# Patient Record
Sex: Female | Born: 1950 | Race: White | Hispanic: No | Smoking: Former smoker
Health system: Southern US, Community
[De-identification: ages and names within clinical notes are randomized; demographics above are authoritative.]

## PROBLEM LIST (undated history)

## (undated) DIAGNOSIS — Z932 Ileostomy status: Secondary | ICD-10-CM

## (undated) DIAGNOSIS — Z8489 Family history of other specified conditions: Secondary | ICD-10-CM

## (undated) DIAGNOSIS — S82841A Displaced bimalleolar fracture of right lower leg, initial encounter for closed fracture: Secondary | ICD-10-CM

## (undated) DIAGNOSIS — R112 Nausea with vomiting, unspecified: Secondary | ICD-10-CM

## (undated) DIAGNOSIS — Z98811 Dental restoration status: Secondary | ICD-10-CM

## (undated) DIAGNOSIS — M8580 Other specified disorders of bone density and structure, unspecified site: Secondary | ICD-10-CM

## (undated) DIAGNOSIS — Z8719 Personal history of other diseases of the digestive system: Secondary | ICD-10-CM

## (undated) DIAGNOSIS — L900 Lichen sclerosus et atrophicus: Secondary | ICD-10-CM

## (undated) DIAGNOSIS — Z9889 Other specified postprocedural states: Secondary | ICD-10-CM

## (undated) HISTORY — PX: TUBAL LIGATION: SHX77

## (undated) HISTORY — PX: REDUCTION MAMMAPLASTY: SUR839

## (undated) HISTORY — DX: Lichen sclerosus et atrophicus: L90.0

## (undated) HISTORY — DX: Other specified disorders of bone density and structure, unspecified site: M85.80

## (undated) HISTORY — PX: STRABISMUS SURGERY: SHX218

---

## 2002-03-02 HISTORY — PX: TOTAL COLECTOMY: SHX852

## 2007-03-10 ENCOUNTER — Emergency Department (HOSPITAL_COMMUNITY): Admission: EM | Admit: 2007-03-10 | Discharge: 2007-03-10 | Payer: Self-pay | Admitting: Emergency Medicine

## 2007-05-09 ENCOUNTER — Other Ambulatory Visit: Admission: RE | Admit: 2007-05-09 | Discharge: 2007-05-09 | Payer: Self-pay | Admitting: Internal Medicine

## 2008-09-24 ENCOUNTER — Ambulatory Visit: Payer: Self-pay | Admitting: Internal Medicine

## 2008-10-19 ENCOUNTER — Encounter: Admission: RE | Admit: 2008-10-19 | Discharge: 2008-10-19 | Payer: Self-pay | Admitting: Internal Medicine

## 2008-10-25 ENCOUNTER — Encounter: Admission: RE | Admit: 2008-10-25 | Discharge: 2008-10-25 | Payer: Self-pay | Admitting: Internal Medicine

## 2008-11-16 ENCOUNTER — Ambulatory Visit: Payer: Self-pay | Admitting: Internal Medicine

## 2008-12-27 ENCOUNTER — Ambulatory Visit: Payer: Self-pay | Admitting: Internal Medicine

## 2009-03-02 HISTORY — PX: BREAST REDUCTION SURGERY: SHX8

## 2009-03-29 ENCOUNTER — Ambulatory Visit: Payer: Self-pay | Admitting: Internal Medicine

## 2009-09-12 ENCOUNTER — Ambulatory Visit: Payer: Self-pay | Admitting: Internal Medicine

## 2009-10-17 ENCOUNTER — Encounter: Admission: RE | Admit: 2009-10-17 | Discharge: 2009-10-17 | Payer: Self-pay | Admitting: Internal Medicine

## 2009-11-25 ENCOUNTER — Encounter: Admission: RE | Admit: 2009-11-25 | Discharge: 2009-11-25 | Payer: Self-pay | Admitting: Internal Medicine

## 2009-11-29 ENCOUNTER — Encounter: Admission: RE | Admit: 2009-11-29 | Discharge: 2009-11-29 | Payer: Self-pay | Admitting: Internal Medicine

## 2009-12-06 ENCOUNTER — Ambulatory Visit: Payer: Self-pay | Admitting: Internal Medicine

## 2010-02-11 ENCOUNTER — Encounter
Admission: RE | Admit: 2010-02-11 | Discharge: 2010-02-11 | Payer: Self-pay | Source: Home / Self Care | Attending: Surgery | Admitting: Surgery

## 2010-11-26 ENCOUNTER — Other Ambulatory Visit: Payer: Self-pay | Admitting: Internal Medicine

## 2010-11-26 DIAGNOSIS — Z1231 Encounter for screening mammogram for malignant neoplasm of breast: Secondary | ICD-10-CM

## 2010-12-22 ENCOUNTER — Ambulatory Visit
Admission: RE | Admit: 2010-12-22 | Discharge: 2010-12-22 | Disposition: A | Discharge status: Home / Self Care | Payer: BC Managed Care – PPO | Source: Ambulatory Visit | Attending: Internal Medicine | Admitting: Internal Medicine

## 2010-12-22 DIAGNOSIS — Z1231 Encounter for screening mammogram for malignant neoplasm of breast: Secondary | ICD-10-CM

## 2011-11-05 ENCOUNTER — Other Ambulatory Visit: Payer: BC Managed Care – PPO | Admitting: Internal Medicine

## 2011-11-05 DIAGNOSIS — M810 Age-related osteoporosis without current pathological fracture: Secondary | ICD-10-CM

## 2011-11-05 DIAGNOSIS — E559 Vitamin D deficiency, unspecified: Secondary | ICD-10-CM

## 2011-11-05 DIAGNOSIS — Z0000 Encounter for general adult medical examination without abnormal findings: Secondary | ICD-10-CM

## 2011-11-05 LAB — CBC WITH DIFFERENTIAL/PLATELET
Eosinophils Absolute: 0.3 10*3/uL (ref 0.0–0.7)
Eosinophils Relative: 5 % (ref 0–5)
HCT: 38.8 % (ref 36.0–46.0)
Hemoglobin: 13.3 g/dL (ref 12.0–15.0)
Lymphocytes Relative: 22 % (ref 12–46)
Lymphs Abs: 1.2 10*3/uL (ref 0.7–4.0)
MCH: 32.4 pg (ref 26.0–34.0)
MCV: 94.6 fL (ref 78.0–100.0)
Monocytes Absolute: 0.5 10*3/uL (ref 0.1–1.0)
Monocytes Relative: 9 % (ref 3–12)
RBC: 4.1 MIL/uL (ref 3.87–5.11)
WBC: 5.3 10*3/uL (ref 4.0–10.5)

## 2011-11-05 LAB — LIPID PANEL
Cholesterol: 170 mg/dL (ref 0–200)
Total CHOL/HDL Ratio: 1.8 Ratio
Triglycerides: 68 mg/dL (ref <150)
VLDL: 14 mg/dL (ref 0–40)

## 2011-11-05 LAB — COMPREHENSIVE METABOLIC PANEL
ALT: 33 U/L (ref 0–35)
CO2: 26 mEq/L (ref 19–32)
Calcium: 9.2 mg/dL (ref 8.4–10.5)
Chloride: 106 mEq/L (ref 96–112)
Creat: 0.87 mg/dL (ref 0.50–1.10)
Glucose, Bld: 95 mg/dL (ref 70–99)
Total Bilirubin: 0.4 mg/dL (ref 0.3–1.2)

## 2011-11-05 LAB — T4, FREE: Free T4: 0.96 ng/dL (ref 0.80–1.80)

## 2011-11-06 LAB — VITAMIN D 25 HYDROXY (VIT D DEFICIENCY, FRACTURES): Vit D, 25-Hydroxy: 25 ng/mL — ABNORMAL LOW (ref 30–89)

## 2011-11-09 ENCOUNTER — Ambulatory Visit (INDEPENDENT_AMBULATORY_CARE_PROVIDER_SITE_OTHER): Payer: BC Managed Care – PPO | Admitting: Internal Medicine

## 2011-11-09 ENCOUNTER — Encounter: Payer: Self-pay | Admitting: Internal Medicine

## 2011-11-09 VITALS — BP 122/70 | HR 70 | Ht 65.25 in | Wt 185.0 lb

## 2011-11-09 DIAGNOSIS — Z9889 Other specified postprocedural states: Secondary | ICD-10-CM

## 2011-11-09 DIAGNOSIS — Z0000 Encounter for general adult medical examination without abnormal findings: Secondary | ICD-10-CM

## 2011-11-09 DIAGNOSIS — M810 Age-related osteoporosis without current pathological fracture: Secondary | ICD-10-CM

## 2011-11-09 DIAGNOSIS — Z87898 Personal history of other specified conditions: Secondary | ICD-10-CM

## 2011-11-09 DIAGNOSIS — Z8639 Personal history of other endocrine, nutritional and metabolic disease: Secondary | ICD-10-CM

## 2011-11-09 DIAGNOSIS — K5090 Crohn's disease, unspecified, without complications: Secondary | ICD-10-CM

## 2011-11-09 LAB — POCT URINALYSIS DIPSTICK
Bilirubin, UA: NEGATIVE
Blood, UA: NEGATIVE
Glucose, UA: NEGATIVE
Nitrite, UA: NEGATIVE
Spec Grav, UA: 1.025
Urobilinogen, UA: NEGATIVE

## 2011-11-13 ENCOUNTER — Other Ambulatory Visit: Payer: Self-pay | Admitting: Internal Medicine

## 2011-11-16 ENCOUNTER — Other Ambulatory Visit: Payer: Self-pay | Admitting: Internal Medicine

## 2011-11-16 DIAGNOSIS — M810 Age-related osteoporosis without current pathological fracture: Secondary | ICD-10-CM

## 2011-11-16 DIAGNOSIS — Z780 Asymptomatic menopausal state: Secondary | ICD-10-CM

## 2011-11-17 ENCOUNTER — Other Ambulatory Visit: Payer: Self-pay | Admitting: Internal Medicine

## 2011-11-17 DIAGNOSIS — Z1231 Encounter for screening mammogram for malignant neoplasm of breast: Secondary | ICD-10-CM

## 2011-12-25 ENCOUNTER — Ambulatory Visit
Admission: RE | Admit: 2011-12-25 | Discharge: 2011-12-25 | Disposition: A | Discharge status: Home / Self Care | Payer: BC Managed Care – PPO | Source: Ambulatory Visit | Attending: Internal Medicine | Admitting: Internal Medicine

## 2011-12-25 DIAGNOSIS — M810 Age-related osteoporosis without current pathological fracture: Secondary | ICD-10-CM

## 2011-12-25 DIAGNOSIS — Z1231 Encounter for screening mammogram for malignant neoplasm of breast: Secondary | ICD-10-CM

## 2011-12-25 DIAGNOSIS — Z780 Asymptomatic menopausal state: Secondary | ICD-10-CM

## 2012-02-07 ENCOUNTER — Encounter: Payer: Self-pay | Admitting: Internal Medicine

## 2012-02-07 DIAGNOSIS — Z9889 Other specified postprocedural states: Secondary | ICD-10-CM | POA: Insufficient documentation

## 2012-02-07 DIAGNOSIS — K5090 Crohn's disease, unspecified, without complications: Secondary | ICD-10-CM | POA: Insufficient documentation

## 2012-02-07 DIAGNOSIS — M810 Age-related osteoporosis without current pathological fracture: Secondary | ICD-10-CM | POA: Insufficient documentation

## 2012-02-07 NOTE — Patient Instructions (Addendum)
Continue same medications and return in one year. For dependent edema try Maxide 25 1 by mouth as needed

## 2012-02-07 NOTE — Progress Notes (Signed)
  Subjective:    Patient ID: Beverly [name], female    DOB: 1951-01-17, 61 y.o.   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 History of osteoporosis, vitamin D deficiency and history of Crohn's disease. Patient is intolerant of Flagyl it causes numbness in her feet. History of stress fracture right foot October 2008. Patient had colectomy March 2004 for inflammatory bowel disease. Patient had surgery for strabismus in childhood and also on both eyes in 1989. Bilateral tubal ligation 1984. Patient had sigmoidoscopy by Dr. Ewing Schlein in July 2010. This history of bilateral breast reductions. She is to take Fosamax but quit in 2008. In April 2005 she [place]. Lumbar spine had a CEA value of -2.0 and right hip had a T. value of -2.6. This was consistent with osteoporosis.  Study in 2010 showed some improvement in bone density in right hip and lumbar spine.  Patient has been on Imuran since 2004. Followed by Dr. Ewing Schlein for inflammatory bowel disease. Crohn's disease with diagnosed in 1984.  Family history: Father died age 62 with history of prostate cancer. One sister with history of fibromyalgia and prediabetes. 2 adult sons. Mother with history of cancer.  Social history: Patient has a Event organiser and J.D. Degree. Patient lives with her son. She is divorced. Does not smoke. She quit smoking in 2002. Social alcohol consumption consisting of wine daily.    Review of Systems  Constitutional: Positive for fatigue.  HENT: Negative.   Eyes: Negative.   Respiratory: Negative.   Cardiovascular: Negative.   Gastrointestinal:       History of Crohn's disease  Genitourinary: Negative.   Musculoskeletal:       History of osteoporosis  Neurological: Negative.   Hematological: Negative.   Psychiatric/Behavioral: Negative.        Objective:   Physical Exam   Vitals reviewed. Constitutional: She appears well-developed and well-nourished. No distress.  HENT:  Head: Normocephalic and atraumatic.  Right Ear: External ear normal.  Left Ear: External ear normal.  Mouth/Throat: Oropharynx is clear and moist.  Eyes: Conjunctivae normal and EOM are normal. Right eye exhibits no discharge. Left eye exhibits no discharge. No scleral icterus.  Neck: Neck supple. No JVD present. No thyromegaly present.  Cardiovascular: Normal rate, regular rhythm, normal heart sounds and intact distal pulses.   No murmur heard. Pulmonary/Chest: Effort normal and breath sounds normal. No respiratory distress. She has no wheezes. She has no rales. She exhibits no tenderness.       Breast reduction noted. No masses.   Abdominal: Bowel sounds are normal. She exhibits no distension and no mass. There is no tenderness. There is no rebound and no guarding.  Musculoskeletal: She exhibits no edema.  Lymphadenopathy:    She has no cervical adenopathy.  Neurological: She is alert. She has normal reflexes. No cranial nerve deficit.  Skin: Skin is dry. No rash noted. She is not diaphoretic.  Psychiatric: Her behavior is normal. Judgment and thought content normal.          Assessment & Plan:  Crohn's disease  Status post bilateral breast reduction by Dr. Stephens November  Osteoporosis  History of vitamin D deficiency  Plan: Followup of Crohn's disease per Dr. Ewing Schlein. Continue vitamin D. Bone density study done December 2011 and should repeat late this year or earlier next year.  Return one year or as needed.

## 2012-05-03 ENCOUNTER — Encounter: Payer: Self-pay | Admitting: Gynecology

## 2012-05-03 DIAGNOSIS — M8580 Other specified disorders of bone density and structure, unspecified site: Secondary | ICD-10-CM | POA: Insufficient documentation

## 2012-05-03 DIAGNOSIS — L900 Lichen sclerosus et atrophicus: Secondary | ICD-10-CM | POA: Insufficient documentation

## 2012-06-21 ENCOUNTER — Institutional Professional Consult (permissible substitution): Payer: Self-pay | Admitting: Gynecology

## 2012-07-07 ENCOUNTER — Telehealth: Payer: Self-pay | Admitting: Gynecology

## 2012-07-07 NOTE — Telephone Encounter (Signed)
Patient cancelled her consult appointment for 07/11/12 with Dr. Irene Limbo at [facility]. I called the patient and left a message for her to call us to reschedule.

## 2012-07-11 ENCOUNTER — Institutional Professional Consult (permissible substitution): Payer: Self-pay | Admitting: Gynecology

## 2012-07-11 ENCOUNTER — Ambulatory Visit (INDEPENDENT_AMBULATORY_CARE_PROVIDER_SITE_OTHER): Payer: BC Managed Care – PPO | Admitting: Gynecology

## 2012-07-11 ENCOUNTER — Encounter: Payer: Self-pay | Admitting: Gynecology

## 2012-07-11 VITALS — BP 120/72 | Resp 12 | Wt 186.0 lb

## 2012-07-11 DIAGNOSIS — L940 Localized scleroderma [morphea]: Secondary | ICD-10-CM

## 2012-07-11 DIAGNOSIS — L900 Lichen sclerosus et atrophicus: Secondary | ICD-10-CM

## 2012-07-11 MED ORDER — CLOBETASOL PROPIONATE 0.05 % EX OINT: TOPICAL_OINTMENT | Freq: Two times a day (BID) | CUTANEOUS | Status: DC

## 2012-07-11 NOTE — Progress Notes (Signed)
Subjective:     Patient ID: Beverly [name], female   DOB: January 13, 1951, 62 y.o.   MRN: [id number]  HPI Comments: Here for f/u of lichen sclerosis using temovate mostly once a day, denies any itching and burning.      Review of Systems  All other systems reviewed and are negative.       Objective:   Physical Exam  Genitourinary:     vaginal introitus pliable easy passage of 2 examining fingers     Assessment:     Lichen sclerosis     Plan:     Decrease topical application to qd except at labia-pt would like to con't BID as area con't to improve, remaining tissue stable.  Discussed tapering down ultimately to twice weekly

## 2012-09-12 ENCOUNTER — Ambulatory Visit: Payer: BC Managed Care – PPO | Admitting: Gynecology

## 2012-09-19 ENCOUNTER — Ambulatory Visit (INDEPENDENT_AMBULATORY_CARE_PROVIDER_SITE_OTHER): Payer: BC Managed Care – PPO | Admitting: Gynecology

## 2012-09-19 ENCOUNTER — Encounter: Payer: Self-pay | Admitting: Gynecology

## 2012-09-19 VITALS — BP 100/60 | HR 60 | Ht 65.5 in | Wt 186.0 lb

## 2012-09-19 DIAGNOSIS — L900 Lichen sclerosus et atrophicus: Secondary | ICD-10-CM

## 2012-09-19 DIAGNOSIS — L940 Localized scleroderma [morphea]: Secondary | ICD-10-CM

## 2012-09-19 NOTE — Progress Notes (Signed)
Subjective:     Patient ID: Beverly [name], female   DOB: [birth year], 62 y.o.   MRN: [id number]  HPI Comments: Here for f/u of lichen sclerosis, using temovate mostly twice a day near clitoris and weekly on remaining introitus.  Overall she feel like the tissue continues to improve.    Review of Systems  All other systems reviewed and are negative.       Objective:   Physical Exam  Constitutional: She is oriented to person, place, and time. She appears well-developed and well-nourished.  Genitourinary:     Neurological: She is alert and oriented to person, place, and time. She has normal reflexes.   Minora on right continue to imporve, still less prominent than on the left, hood still scarred but able to retract hood with pressure, remaining introitus is supple, no skin changes or exxcoriations    Assessment:     Lichen sclerosis     Plan:     Cont current treament, can try once a dy near clitoral area Pt informed that full hood retraction is difficult to obtain and probably not possible

## 2012-11-28 ENCOUNTER — Ambulatory Visit (INDEPENDENT_AMBULATORY_CARE_PROVIDER_SITE_OTHER): Payer: BC Managed Care – PPO | Admitting: Gynecology

## 2012-11-28 VITALS — BP 110/66 | HR 68 | Resp 12 | Ht 65.5 in | Wt 188.0 lb

## 2012-11-28 DIAGNOSIS — L940 Localized scleroderma [morphea]: Secondary | ICD-10-CM

## 2012-11-28 DIAGNOSIS — L900 Lichen sclerosus et atrophicus: Secondary | ICD-10-CM

## 2012-11-28 NOTE — Patient Instructions (Addendum)
Decrease temovate to twice a week for 2w then down to once a week there after

## 2012-11-29 NOTE — Progress Notes (Signed)
Subjective:     Patient ID: Beverly [name], female   DOB: Jun 11, 1950, 62 y.o.   MRN: [id number]  HPI Comments: Pt here to follow up lichen sclerosis, using temovate daily to affected areas and overall feels well.  Pt reports not sexually active currently and has no vaginal complaints.    Review of Systems  All other systems reviewed and are negative.       Objective:   Physical Exam  Constitutional: She is oriented to person, place, and time. She appears well-developed and well-nourished.  Genitourinary:     Neurological: She is alert and oriented to person, place, and time.  Skin: Skin is warm and dry.  no other skin changes, no parchement like tissue     Assessment:     Lichen sclerosis doing well     Plan:     Decrease temovate to twice a week fro 2w then decrease to q Sunday F/u 62m

## 2013-07-28 ENCOUNTER — Ambulatory Visit (INDEPENDENT_AMBULATORY_CARE_PROVIDER_SITE_OTHER): Payer: BC Managed Care – PPO | Admitting: Internal Medicine

## 2013-07-28 ENCOUNTER — Encounter: Payer: Self-pay | Admitting: Internal Medicine

## 2013-07-28 VITALS — BP 124/64 | HR 68 | Ht 65.0 in | Wt 189.5 lb

## 2013-07-28 DIAGNOSIS — Z8639 Personal history of other endocrine, nutritional and metabolic disease: Secondary | ICD-10-CM

## 2013-07-28 DIAGNOSIS — S86911A Strain of unspecified muscle(s) and tendon(s) at lower leg level, right leg, initial encounter: Secondary | ICD-10-CM

## 2013-07-28 MED ORDER — MELOXICAM 15 MG PO TABS
15.0000 mg | ORAL_TABLET | Freq: Every day | ORAL | Status: DC
Start: 2013-07-28 — End: 2013-11-09

## 2013-07-28 MED ORDER — TRIAMTERENE-HCTZ 37.5-25 MG PO TABS: 1.0000 | ORAL_TABLET | ORAL | Status: DC | PRN

## 2013-07-28 NOTE — Progress Notes (Signed)
   Subjective:    Patient ID: Beverly [name], female    DOB: 03-12-1950, 63 y.o.   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 She has a history of osteoporosis, vitamin D deficiency and Crohn's disease. Recently her 11 year old mother moved in with patient and previously lived in [place]. Patient has been experiencing pain in her right knee for several weeks. Knee does not give way with her. She's not noted any swelling. She has an upcoming physical exam here in the near future but wanted to be evaluated before physical exam.    Review of Systems     Objective:   Physical Exam no effusion of right knee. No joint line tenderness. Good range of motion. Tender along the lateral collateral ligament to palpation. Not tender along medial collateral ligament.        Assessment & Plan:  Knee strain  History of osteoporosis  History of Crohn's disease  History of vitamin D deficiency  Plan: Ice knee for 20 minutes daily. Take Mobic 15 mg daily. Call if not better in 2 weeks.

## 2013-07-28 NOTE — Patient Instructions (Signed)
Ice knee for 20 minutes daily. Take Mobic 15 mg daily. Call if not better in 2 weeks.

## 2013-08-28 ENCOUNTER — Other Ambulatory Visit: Payer: BC Managed Care – PPO | Admitting: Internal Medicine

## 2013-08-28 ENCOUNTER — Other Ambulatory Visit: Payer: Self-pay | Admitting: Internal Medicine

## 2013-08-28 DIAGNOSIS — Z130 Encounter for screening for diseases of the blood and blood-forming organs and certain disorders involving the immune mechanism: Secondary | ICD-10-CM

## 2013-08-28 DIAGNOSIS — E559 Vitamin D deficiency, unspecified: Secondary | ICD-10-CM

## 2013-08-28 DIAGNOSIS — Z1329 Encounter for screening for other suspected endocrine disorder: Secondary | ICD-10-CM

## 2013-08-28 DIAGNOSIS — Z0000 Encounter for general adult medical examination without abnormal findings: Secondary | ICD-10-CM

## 2013-08-28 DIAGNOSIS — Z13220 Encounter for screening for lipoid disorders: Secondary | ICD-10-CM

## 2013-08-28 LAB — CBC WITH DIFFERENTIAL/PLATELET
BASOS ABS: 0.1 10*3/uL (ref 0.0–0.1)
BASOS PCT: 1 % (ref 0–1)
EOS ABS: 0.3 10*3/uL (ref 0.0–0.7)
EOS PCT: 5 % (ref 0–5)
HEMATOCRIT: 40.6 % (ref 36.0–46.0)
Hemoglobin: 13.9 g/dL (ref 12.0–15.0)
Lymphocytes Relative: 20 % (ref 12–46)
Lymphs Abs: 1.3 10*3/uL (ref 0.7–4.0)
MCH: 32.7 pg (ref 26.0–34.0)
MCHC: 34.2 g/dL (ref 30.0–36.0)
MCV: 95.5 fL (ref 78.0–100.0)
MONO ABS: 0.7 10*3/uL (ref 0.1–1.0)
Monocytes Relative: 10 % (ref 3–12)
NEUTROS ABS: 4.2 10*3/uL (ref 1.7–7.7)
Neutrophils Relative %: 64 % (ref 43–77)
Platelets: 273 10*3/uL (ref 150–400)
RBC: 4.25 MIL/uL (ref 3.87–5.11)
RDW: 13.3 % (ref 11.5–15.5)
WBC: 6.5 10*3/uL (ref 4.0–10.5)

## 2013-08-29 ENCOUNTER — Ambulatory Visit (INDEPENDENT_AMBULATORY_CARE_PROVIDER_SITE_OTHER): Payer: BC Managed Care – PPO | Admitting: Internal Medicine

## 2013-08-29 ENCOUNTER — Encounter: Payer: Self-pay | Admitting: Internal Medicine

## 2013-08-29 VITALS — BP 134/78 | HR 60 | Temp 98.6°F | Ht 66.0 in | Wt 189.0 lb

## 2013-08-29 DIAGNOSIS — Z8719 Personal history of other diseases of the digestive system: Secondary | ICD-10-CM

## 2013-08-29 DIAGNOSIS — E559 Vitamin D deficiency, unspecified: Secondary | ICD-10-CM

## 2013-08-29 DIAGNOSIS — R7989 Other specified abnormal findings of blood chemistry: Secondary | ICD-10-CM

## 2013-08-29 DIAGNOSIS — R7302 Impaired glucose tolerance (oral): Secondary | ICD-10-CM

## 2013-08-29 DIAGNOSIS — R945 Abnormal results of liver function studies: Secondary | ICD-10-CM

## 2013-08-29 DIAGNOSIS — R7309 Other abnormal glucose: Secondary | ICD-10-CM

## 2013-08-29 DIAGNOSIS — Z0000 Encounter for general adult medical examination without abnormal findings: Secondary | ICD-10-CM

## 2013-08-29 DIAGNOSIS — M810 Age-related osteoporosis without current pathological fracture: Secondary | ICD-10-CM

## 2013-08-29 LAB — COMPREHENSIVE METABOLIC PANEL
ALK PHOS: 117 U/L (ref 39–117)
ALT: 51 U/L — ABNORMAL HIGH (ref 0–35)
AST: 47 U/L — AB (ref 0–37)
Albumin: 4.4 g/dL (ref 3.5–5.2)
BUN: 17 mg/dL (ref 6–23)
CO2: 25 mEq/L (ref 19–32)
CREATININE: 0.84 mg/dL (ref 0.50–1.10)
Calcium: 9.3 mg/dL (ref 8.4–10.5)
Chloride: 106 mEq/L (ref 96–112)
Glucose, Bld: 109 mg/dL — ABNORMAL HIGH (ref 70–99)
Potassium: 4.2 mEq/L (ref 3.5–5.3)
Sodium: 140 mEq/L (ref 135–145)
Total Bilirubin: 0.4 mg/dL (ref 0.2–1.2)
Total Protein: 6.6 g/dL (ref 6.0–8.3)

## 2013-08-29 LAB — HEMOGLOBIN A1C
Hgb A1c MFr Bld: 5.7 % — ABNORMAL HIGH (ref <5.7)
MEAN PLASMA GLUCOSE: 117 mg/dL — AB (ref <117)

## 2013-08-29 LAB — POCT URINALYSIS DIPSTICK
Bilirubin, UA: NEGATIVE
GLUCOSE UA: NEGATIVE
KETONES UA: NEGATIVE
Leukocytes, UA: NEGATIVE
Nitrite, UA: NEGATIVE
PROTEIN UA: NEGATIVE
RBC UA: NEGATIVE
SPEC GRAV UA: 1.02
UROBILINOGEN UA: NEGATIVE
pH, UA: 6

## 2013-08-29 LAB — LIPID PANEL
CHOL/HDL RATIO: 1.8 ratio
Cholesterol: 179 mg/dL (ref 0–200)
HDL: 102 mg/dL (ref >39)
LDL CALC: 65 mg/dL (ref 0–99)
TRIGLYCERIDES: 62 mg/dL (ref <150)
VLDL: 12 mg/dL (ref 0–40)

## 2013-08-29 LAB — VITAMIN D 25 HYDROXY (VIT D DEFICIENCY, FRACTURES): Vit D, 25-Hydroxy: 22 ng/mL — ABNORMAL LOW (ref 30–89)

## 2013-08-29 LAB — TSH: TSH: 3.238 u[IU]/mL (ref 0.350–4.500)

## 2013-08-29 NOTE — Progress Notes (Signed)
Subjective:    Patient ID: Beverly [name], female    DOB: [birth year], 63 y.o.   MRN: [id number]  HPI 63 year old White Female for health maintenance and evaluation of medical issues. Patient has a history of Crohn's disease, is status post bilateral breast reduction, history of lichen sclerosis, history of vitamin D deficiency.  She is intolerant of Flagyl because she says it causes numbness in her feet.  History of stress fracture right foot October 2008. Patient had colectomy in March 2004 for inflammatory bowel disease. She had surgery for strabismus in childhood and also on both eyes in 1989. Bilateral tubal ligation 1984. History of bilateral breast reductions.  She used to take Fosamax but quit in 2008. In April 2005 she had a bone density study while [place] of [place]. Lumbar spine headache T. value of -2.0 and a right hip T. value of -2.6. This was consistent with osteoporosis of the right hip.  Study in 2010 showed some improvement in bone density right hip and lumbar spine. Bone density study in 2013 showed T score LS-spine -1.5 and femur -2.3.  She had been on Imuran since 2004 but is now off of this medication for about 3 years and is followed by Dr. Ewing Schlein for inflammatory bowel disease. Crohn's disease was diagnosed in 1984. She had flexible sigmoidoscopy by Dr. Ewing Schlein July 2010.  GYN is Doug[facility] Rivers.   Family history: Father died at age 73 with history of prostate cancer. One sister with history of fibromyalgia and prediabetes. 2 adult sons. Mother with history of endometrial and colon cancer as well as history of hypothyroidism and hypertension. Mother is living and is 42 years old.  Social history: Patient has a Event organiser and a JD degree. She lives with her son. She is divorced. Does not smoke. She quit smoking in 2002. Social alcohol consumption consisting of wine daily. Has been consuming 2-3 glasses at night to relax.    Review of Systems  Constitutional:  Negative.   HENT: Negative.   Respiratory: Negative.   Cardiovascular: Negative.   Gastrointestinal: Negative.   Endocrine: Negative.   Genitourinary: Negative.   Psychiatric/Behavioral:       Some situational stress  All other systems reviewed and are negative.      Objective:   Physical Exam  Vitals reviewed. Constitutional: She is oriented to person, place, and time. She appears well-developed and well-nourished. No distress.  HENT:  Head: Normocephalic and atraumatic.  Right Ear: External ear normal.  Left Ear: External ear normal.  Mouth/Throat: Oropharynx is clear and moist. No oropharyngeal exudate.  Eyes: Conjunctivae and EOM are normal. Pupils are equal, round, and reactive to light. Right eye exhibits no discharge. Left eye exhibits no discharge. No scleral icterus.  Neck: Neck supple. No JVD present. No thyromegaly present.  Cardiovascular: Normal rate, regular rhythm, normal heart sounds and intact distal pulses.   No murmur heard. Pulmonary/Chest: Breath sounds normal. No respiratory distress. She has no wheezes. She has no rales.  Breasts normal female. Bilateral breast reduction  Abdominal: Soft. Bowel sounds are normal. She exhibits no distension and no mass. There is no tenderness. There is no rebound and no guarding.  Genitourinary:  Deferred to GYN  Musculoskeletal: Normal range of motion. She exhibits no edema.  Lymphadenopathy:    She has no cervical adenopathy.  Neurological: She is alert and oriented to person, place, and time. She has normal reflexes. She displays normal reflexes. No cranial nerve deficit. Coordination normal.  Skin: Skin is warm and dry. No rash noted. She is not diaphoretic.  Psychiatric: She has a normal mood and affect. Her behavior is normal. Judgment and thought content normal.          Assessment & Plan:  Vitamin D deficiency-recommend 2000 units vitamin D 3 daily  Osteoporosis-last bone density study 2013. Have another  bone density study in the near future. Did take Fosamax for a while but discontinued in 2008. Will make recommendations based on next bone density study. Last study in 2013 showed some improvement in bone density but history of T score consistent with osteoporosis in the past. Needs to take vitamin D.  History of Crohn's disease followed by Dr. 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 Watch diet and exercise.  History bilateral breast reduction  Plan: Repeat liver functions in 8 weeks. Decrease wine consumption. Take vitamin D 3 daily. Monitor calories with borderline hemoglobin A1c.

## 2013-08-29 NOTE — Patient Instructions (Signed)
Check blood pressure at home and let me know if persistently elevated. Repeat liver function test in 8 weeks. Decrease alcohol consumption. Return in 6 months for followup on diet exercise weight loss and borderline diabetes.

## 2013-08-30 NOTE — Progress Notes (Signed)
Patient informed. 

## 2013-10-11 ENCOUNTER — Ambulatory Visit
Admission: RE | Admit: 2013-10-11 | Discharge: 2013-10-11 | Disposition: A | Discharge status: Home / Self Care | Payer: BC Managed Care – PPO | Source: Ambulatory Visit | Attending: Internal Medicine | Admitting: Internal Medicine

## 2013-10-11 DIAGNOSIS — Z0000 Encounter for general adult medical examination without abnormal findings: Secondary | ICD-10-CM

## 2013-10-31 ENCOUNTER — Other Ambulatory Visit: Payer: BC Managed Care – PPO | Admitting: Internal Medicine

## 2013-11-09 ENCOUNTER — Encounter: Payer: Self-pay | Admitting: Internal Medicine

## 2013-11-09 ENCOUNTER — Ambulatory Visit (INDEPENDENT_AMBULATORY_CARE_PROVIDER_SITE_OTHER): Payer: BC Managed Care – PPO | Admitting: Internal Medicine

## 2013-11-09 VITALS — BP 128/82 | HR 72 | Temp 100.0°F | Ht 65.0 in | Wt 189.0 lb

## 2013-11-09 DIAGNOSIS — J069 Acute upper respiratory infection, unspecified: Secondary | ICD-10-CM

## 2013-11-09 MED ORDER — BENZONATATE 100 MG PO CAPS: 200.0000 mg | ORAL_CAPSULE | Freq: Three times a day (TID) | ORAL | Status: DC

## 2013-11-09 MED ORDER — CLARITHROMYCIN 500 MG PO TABS: 500.0000 mg | ORAL_TABLET | Freq: Two times a day (BID) | ORAL | Status: DC

## 2013-11-11 ENCOUNTER — Encounter: Payer: Self-pay | Admitting: Internal Medicine

## 2013-11-14 ENCOUNTER — Other Ambulatory Visit: Payer: BC Managed Care – PPO | Admitting: Internal Medicine

## 2013-11-17 ENCOUNTER — Other Ambulatory Visit: Payer: BC Managed Care – PPO | Admitting: Internal Medicine

## 2013-11-17 DIAGNOSIS — K5090 Crohn's disease, unspecified, without complications: Secondary | ICD-10-CM

## 2013-11-17 LAB — HEPATIC FUNCTION PANEL
ALBUMIN: 4.1 g/dL (ref 3.5–5.2)
ALK PHOS: 131 U/L — AB (ref 39–117)
ALT: 53 U/L — ABNORMAL HIGH (ref 0–35)
AST: 35 U/L (ref 0–37)
Bilirubin, Direct: 0.1 mg/dL (ref 0.0–0.3)
Indirect Bilirubin: 0.3 mg/dL (ref 0.2–1.2)
TOTAL PROTEIN: 6.5 g/dL (ref 6.0–8.3)
Total Bilirubin: 0.4 mg/dL (ref 0.2–1.2)

## 2013-11-20 ENCOUNTER — Telehealth: Payer: Self-pay

## 2013-11-20 NOTE — Telephone Encounter (Signed)
Left message for patient to call office.  Per Dr Lenord Fellers patient needs an ultrasound of the liver.  Appointment made at [facility] for 11/29/2013 at 815am.  Patient is NPO after midnight.

## 2013-11-20 NOTE — Telephone Encounter (Signed)
Message copied by Judd Gaudier on Mon Nov 20, 2013  3:29 PM ------      Message from: Margaree Mackintosh      Created: Sat Nov 18, 2013 10:58 AM       Improvement in AST but not ALT. Recommend ultrasound of liver. Please arrange ------

## 2013-11-20 NOTE — Addendum Note (Signed)
Addended by: Judd Gaudier on: 11/20/2013 03:51 PM   Modules accepted: Orders

## 2013-12-08 ENCOUNTER — Other Ambulatory Visit: Payer: BC Managed Care – PPO

## 2013-12-18 ENCOUNTER — Other Ambulatory Visit: Payer: BC Managed Care – PPO

## 2013-12-20 ENCOUNTER — Ambulatory Visit
Admission: RE | Admit: 2013-12-20 | Discharge: 2013-12-20 | Disposition: A | Discharge status: Home / Self Care | Payer: BC Managed Care – PPO | Source: Ambulatory Visit | Attending: Internal Medicine | Admitting: Internal Medicine

## 2013-12-20 DIAGNOSIS — K5090 Crohn's disease, unspecified, without complications: Secondary | ICD-10-CM

## 2013-12-21 ENCOUNTER — Telehealth: Payer: Self-pay

## 2013-12-21 NOTE — Telephone Encounter (Signed)
Message copied by Judd Gaudier on Thu Dec 21, 2013 10:36 AM ------      Message from: Margaree Mackintosh      Created: Wed Dec 20, 2013 11:59 AM       Fatty liver. Advise low fat diet and watch ETOH intake. ------

## 2013-12-21 NOTE — Telephone Encounter (Signed)
Patient informed of ultrasound results.  Results mailed to her by her request.

## 2014-01-01 ENCOUNTER — Encounter: Payer: Self-pay | Admitting: Internal Medicine

## 2014-01-14 NOTE — Progress Notes (Signed)
   Subjective:    Patient ID: Beverly [name], female    DOB: [birth year], 63 y.o.   MRN: [id number]  HPI  In today with URI symptoms. Has had fever and malaise. Cough and congestion.Cough has been productive. Clearly doesn't feel well today.    Review of Systems     Objective:   Physical Exam  Skin warm and dry. Nodes none. Pharynx slightly injected. TMs are clear. Neck supple. Chest clear to auscultation.      Assessment & Plan:  Acute URI  Plan: Biaxin 500 mg twice daily for 10 days. Tessalon Perles take as directed for cough.

## 2014-01-14 NOTE — Patient Instructions (Addendum)
Take Biaxin and Tessalon Perles as prescribed.

## 2014-01-29 ENCOUNTER — Telehealth: Payer: Self-pay

## 2014-01-29 NOTE — Telephone Encounter (Signed)
Patient was wondering if Dr Lenord FellersBaxley had any suggestions for a counselor.  Related for family issues.  Please advise.

## 2014-01-29 NOTE — Telephone Encounter (Signed)
Veto Kemps or Ollen Gross are excellent. They are in phone book and make their own appts. Mrs. Rosser does not take [facility] but Ms. Andrey Campanile does. Mrs. Rosser takes [employer].

## 2014-01-30 NOTE — Telephone Encounter (Signed)
Patient informed of counselor names.

## 2014-03-13 ENCOUNTER — Ambulatory Visit: Payer: BC Managed Care – PPO | Admitting: Internal Medicine

## 2014-06-01 ENCOUNTER — Emergency Department (HOSPITAL_COMMUNITY): Payer: Worker's Compensation

## 2014-06-01 ENCOUNTER — Encounter (HOSPITAL_COMMUNITY): Payer: Self-pay | Admitting: Emergency Medicine

## 2014-06-01 ENCOUNTER — Emergency Department (HOSPITAL_COMMUNITY)
Admission: EM | Admit: 2014-06-01 | Discharge: 2014-06-02 | Disposition: A | Discharge status: Home / Self Care | Payer: Worker's Compensation | Attending: Emergency Medicine | Admitting: Emergency Medicine

## 2014-06-01 DIAGNOSIS — Z79899 Other long term (current) drug therapy: Secondary | ICD-10-CM | POA: Insufficient documentation

## 2014-06-01 DIAGNOSIS — Z87891 Personal history of nicotine dependence: Secondary | ICD-10-CM | POA: Insufficient documentation

## 2014-06-01 DIAGNOSIS — Y998 Other external cause status: Secondary | ICD-10-CM | POA: Insufficient documentation

## 2014-06-01 DIAGNOSIS — Y9301 Activity, walking, marching and hiking: Secondary | ICD-10-CM | POA: Diagnosis not present

## 2014-06-01 DIAGNOSIS — Z8719 Personal history of other diseases of the digestive system: Secondary | ICD-10-CM | POA: Diagnosis not present

## 2014-06-01 DIAGNOSIS — W1839XA Other fall on same level, initial encounter: Secondary | ICD-10-CM | POA: Insufficient documentation

## 2014-06-01 DIAGNOSIS — S82841A Displaced bimalleolar fracture of right lower leg, initial encounter for closed fracture: Secondary | ICD-10-CM | POA: Insufficient documentation

## 2014-06-01 DIAGNOSIS — Z792 Long term (current) use of antibiotics: Secondary | ICD-10-CM | POA: Insufficient documentation

## 2014-06-01 DIAGNOSIS — Z872 Personal history of diseases of the skin and subcutaneous tissue: Secondary | ICD-10-CM | POA: Diagnosis not present

## 2014-06-01 DIAGNOSIS — M8580 Other specified disorders of bone density and structure, unspecified site: Secondary | ICD-10-CM | POA: Diagnosis not present

## 2014-06-01 DIAGNOSIS — Y9289 Other specified places as the place of occurrence of the external cause: Secondary | ICD-10-CM | POA: Diagnosis not present

## 2014-06-01 DIAGNOSIS — S99911A Unspecified injury of right ankle, initial encounter: Secondary | ICD-10-CM | POA: Diagnosis present

## 2014-06-01 MED ORDER — OXYCODONE-ACETAMINOPHEN 5-325 MG PO TABS: 1.0000 | ORAL_TABLET | Freq: Four times a day (QID) | ORAL | Status: DC | PRN

## 2014-06-01 MED ORDER — ONDANSETRON HCL 4 MG PO TABS
4.0000 mg | ORAL_TABLET | Freq: Four times a day (QID) | ORAL | Status: DC
Start: 2014-06-01 — End: 2014-06-05

## 2014-06-01 MED ORDER — ONDANSETRON HCL 4 MG/2ML IJ SOLN
4.0000 mg | Freq: Once | INTRAMUSCULAR | Status: AC
  Administered 2014-06-01: 4 mg via INTRAVENOUS
  Filled 2014-06-01: qty 2

## 2014-06-01 MED ORDER — PROPOFOL 10 MG/ML IV BOLUS
0.5000 mg/kg | Freq: Once | INTRAVENOUS | Status: AC
  Filled 2014-06-01: qty 1

## 2014-06-01 MED ORDER — BACITRACIN ZINC 500 UNIT/GM EX OINT
TOPICAL_OINTMENT | CUTANEOUS | Status: AC
  Administered 2014-06-01: 1 via TOPICAL
  Filled 2014-06-01: qty 0.9

## 2014-06-01 MED ORDER — HYDROMORPHONE HCL 1 MG/ML IJ SOLN
0.5000 mg | Freq: Once | INTRAMUSCULAR | Status: AC
  Administered 2014-06-01: 0.5 mg via INTRAVENOUS
  Filled 2014-06-01: qty 1

## 2014-06-01 MED ORDER — BACITRACIN ZINC 500 UNIT/GM EX OINT
1.0000 "application " | TOPICAL_OINTMENT | Freq: Once | CUTANEOUS | Status: AC
  Administered 2014-06-01: 1 via TOPICAL

## 2014-06-01 MED ORDER — PROPOFOL 10 MG/ML IV BOLUS
INTRAVENOUS | Status: AC | PRN
  Administered 2014-06-01: 40 mg via INTRAVENOUS

## 2014-06-01 MED ORDER — HYDROMORPHONE HCL 1 MG/ML IJ SOLN
1.0000 mg | Freq: Once | INTRAMUSCULAR | Status: AC
  Administered 2014-06-01: 1 mg via INTRAVENOUS
  Filled 2014-06-01: qty 1

## 2014-06-01 NOTE — Discharge Instructions (Signed)
Ankle Fracture °A fracture is a break in a bone. The ankle joint is made up of three bones. These include the lower (distal) sections of your lower leg bones, called the tibia and fibula, along with a bone in your foot, called the talus. Depending on how bad the break is and if more than one ankle joint bone is broken, a cast or splint is used to protect and keep your injured bone from moving while it heals. Sometimes, surgery is required to help the fracture heal properly.  °There are two general types of fractures: °· Stable fracture. This includes a single fracture line through one bone, with no injury to ankle ligaments. A fracture of the talus that does not have any displacement (movement of the bone on either side of the fracture line) is also stable. °· Unstable fracture. This includes more than one fracture line through one or more bones in the ankle joint. It also includes fractures that have displacement of the bone on either side of the fracture line. °CAUSES °· A direct blow to the ankle.   °· Quickly and severely twisting your ankle. °· Trauma, such as a car accident or falling from a significant height. °RISK FACTORS °You may be at a higher risk of ankle fracture if: °· You have certain medical conditions. °· You are involved in high-impact sports. °· You are involved in a high-impact car accident. °SIGNS AND SYMPTOMS  °· Tender and swollen ankle. °· Bruising around the injured ankle. °· Pain on movement of the ankle. °· Difficulty walking or putting weight on the ankle. °· A cold foot below the site of the ankle injury. This can occur if the blood vessels passing through your injured ankle were also damaged. °· Numbness in the foot below the site of the ankle injury. °DIAGNOSIS  °An ankle fracture is usually diagnosed with a physical exam and X-rays. A CT scan may also be required for complex fractures. °TREATMENT  °Stable fractures are treated with a cast or splint and using crutches to avoid putting  weight on your injured ankle. This is followed by an ankle strengthening program. Some patients require a special type of cast, depending on other medical problems they may have. Unstable fractures require surgery to ensure the bones heal properly. Your health care provider will tell you what type of fracture you have and the best treatment for your condition. °HOME CARE INSTRUCTIONS  °· Review correct crutch use with your health care provider and use your crutches as directed. Safe use of crutches is extremely important. Misuse of crutches can cause you to fall or cause injury to nerves in your hands or armpits. °· Do not put weight or pressure on the injured ankle until directed by your health care provider. °· To lessen the swelling, keep the injured leg elevated while sitting or lying down. °· Apply ice to the injured area: °¨ Put ice in a plastic bag. °¨ Place a towel between your cast and the bag. °¨ Leave the ice on for 20 minutes, 2-3 times a day. °· If you have a plaster or fiberglass cast: °¨ Do not try to scratch the skin under the cast with any objects. This can increase your risk of skin infection. °¨ Check the skin around the cast every day. You may put lotion on any red or sore areas. °¨ Keep your cast dry and clean. °· If you have a plaster splint: °¨ Wear the splint as directed. °¨ You may loosen the elastic   around the splint if your toes become numb, tingle, or turn cold or blue. °· Do not put pressure on any part of your cast or splint; it may break. Rest your cast only on a pillow the first 24 hours until it is fully hardened. °· Your cast or splint can be protected during bathing with a plastic bag sealed to your skin with medical tape. Do not lower the cast or splint into water. °· Take medicines as directed by your health care provider. Only take over-the-counter or prescription medicines for pain, discomfort, or fever as directed by your health care provider. °· Do not drive a vehicle until  your health care provider specifically tells you it is safe to do so. °· If your health care provider has given you a follow-up appointment, it is very important to keep that appointment. Not keeping the appointment could result in a chronic or permanent injury, pain, and disability. If you have any problem keeping the appointment, call the facility for assistance. °SEEK MEDICAL CARE IF: °You develop increased swelling or discomfort. °SEEK IMMEDIATE MEDICAL CARE IF:  °· Your cast gets damaged or breaks. °· You have continued severe pain. °· You develop new pain or swelling after the cast was put on. °· Your skin or toenails below the injury turn blue or gray. °· Your skin or toenails below the injury feel cold, numb, or have loss of sensitivity to touch. °· There is a bad smell or pus draining from under the cast. °MAKE SURE YOU:  °· Understand these instructions. °· Will watch your condition. °· Will get help right away if you are not doing well or get worse. °Document Released: 02/14/2000 Document Revised: 02/21/2013 Document Reviewed: 09/15/2012 °Exit[employer]® Patient Information ©2015 [employer]. This information is not intended to replace advice given to you by your health care provider. Make sure you discuss any questions you have with your health care provider. °Cast or Splint Care °Casts and splints support injured limbs and keep bones from moving while they heal. It is important to care for your cast or splint at home.   °HOME CARE INSTRUCTIONS °· Keep the cast or splint uncovered during the drying period. It can take 24 to 48 hours to dry if it is made of plaster. A fiberglass cast will dry in less than 1 hour. °· Do not rest the cast on anything harder than a pillow for the first 24 hours. °· Do not put weight on your injured limb or apply pressure to the cast until your health care provider gives you permission. °· Keep the cast or splint dry. Wet casts or splints can lose their shape and may not support  the limb as well. A wet cast that has lost its shape can also create harmful pressure on your skin when it dries. Also, wet skin can become infected. °¨ Cover the cast or splint with a plastic bag when bathing or when out in the rain or snow. If the cast is on the trunk of the body, take sponge baths until the cast is removed. °¨ If your cast does become wet, dry it with a towel or a blow dryer on the cool setting only. °· Keep your cast or splint clean. Soiled casts may be wiped with a moistened cloth. °· Do not place any hard or soft foreign objects under your cast or splint, such as cotton, toilet paper, lotion, or powder. °· Do not try to scratch the skin under the cast with any   object. The object could get stuck inside the cast. Also, scratching could lead to an infection. If itching is a problem, use a blow dryer on a cool setting to relieve discomfort. °· Do not trim or cut your cast or remove padding from inside of it. °· Exercise all joints next to the injury that are not immobilized by the cast or splint. For example, if you have a long leg cast, exercise the hip joint and toes. If you have an arm cast or splint, exercise the shoulder, elbow, thumb, and fingers. °· Elevate your injured arm or leg on 1 or 2 pillows for the first 1 to 3 days to decrease swelling and pain. It is best if you can comfortably elevate your cast so it is higher than your heart. °SEEK MEDICAL CARE IF:  °· Your cast or splint cracks. °· Your cast or splint is too tight or too loose. °· You have unbearable itching inside the cast. °· Your cast becomes wet or develops a soft spot or area. °· You have a bad smell coming from inside your cast. °· You get an object stuck under your cast. °· Your skin around the cast becomes red or raw. °· You have new pain or worsening pain after the cast has been applied. °SEEK IMMEDIATE MEDICAL CARE IF:  °· You have fluid leaking through the cast. °· You are unable to move your fingers or toes. °· You  have discolored (blue or white), cool, painful, or very swollen fingers or toes beyond the cast. °· You have tingling or numbness around the injured area. °· You have severe pain or pressure under the cast. °· You have any difficulty with your breathing or have shortness of breath. °· You have chest pain. °Document Released: 02/14/2000 Document Revised: 12/07/2012 Document Reviewed: 08/25/2012 °ExitCare® Patient Information ©2015 [employer]. This information is not intended to replace advice given to you by your health care provider. Make sure you discuss any questions you have with your health care provider. ° °

## 2014-06-01 NOTE — ED Notes (Signed)
Pt reports unchanged pain but medication took the edge off.

## 2014-06-01 NOTE — ED Notes (Signed)
Ortho tech at bedside 

## 2014-06-01 NOTE — ED Notes (Signed)
Pt reports feeling  Lightheaded and weak, sts she is still somewhat nauseous. Offered pt happy meal and something to drink which she refused. Dr Madilyn Hook notified.

## 2014-06-01 NOTE — ED Notes (Signed)
Pt here via [facility]  C/o right ankle injury from walking in heels and rolling ankle. Obvious deformity noted. Splint in place from [facility].

## 2014-06-01 NOTE — ED Provider Notes (Addendum)
CSN: [id number]     Arrival date & time 06/01/14  1519 History   First MD Initiated Contact with Patient 06/01/14 1536     Chief Complaint  Patient presents with  . Ankle Injury     Patient is a 64 y.o. female presenting with lower extremity injury. The history is provided by the patient. No language interpreter was used.  Ankle Injury   Beverly [name] presents for evaluation of ankle injury. She was walking when she rolled her ankle and fell to the ground. She reports immediate pain and swelling in the right ankle. She denies any head injury or loss of consciousness. She has a history of Crohn's disease and is status post total colectomy. She has a history of osteopenia. Symptoms are moderate, constant.  Past Medical History  Diagnosis Date  . Lichen sclerosus   . Crohn's disease   . Osteopenia    Past Surgical History  Procedure Laterality Date  . Breast reduction surgery  2011  . Colectomy  2004    ileostomy   Family History  Problem Relation Age of Onset  . Uterine cancer Mother 57  . Colon cancer Mother 35  . Hypertension Mother   . Cancer Mother   . Cancer Father    History  Substance Use Topics  . Smoking status: Former Smoker    Types: Cigarettes    Quit date: 11/08/2000  . Smokeless tobacco: Never Used  . Alcohol Use: 3.5 oz/week    7 drink(s) per week     Comment: socially   OB History    Gravida Para Term Preterm AB TAB SAB Ectopic Multiple Living   Review of Systems  All other systems reviewed and are negative.     Allergies  Flagyl  Home Medications   Prior to Admission medications   Medication Sig Start Date End Date Taking? Authorizing Provider  benzonatate (TESSALON) 100 MG capsule Take 2 capsules (200 mg total) by mouth 3 (three) times daily. 11/09/13   Margaree Mackintosh, MD  BIOTIN PO Take by mouth.    Historical Provider, MD  Cholecalciferol (VITAMIN D3) 2000 UNITS TABS Take by mouth.    Historical Provider, MD   clarithromycin (BIAXIN) 500 MG tablet Take 1 tablet (500 mg total) by mouth 2 (two) times daily. 11/09/13   Margaree Mackintosh, MD  Multiple Vitamin (MULTIVITAMIN) tablet Take 1 tablet by mouth daily.    Historical Provider, MD  triamterene-hydrochlorothiazide (MAXZIDE-25) 37.5-25 MG per tablet Take 1 tablet by mouth as needed. 07/28/13   Margaree Mackintosh, MD   BP 111/91 mmHg  Temp(Src) 97.6 F (36.4 C) (Oral)  SpO2 99% Physical Exam  Constitutional: She is oriented to person, place, and time. She appears well-developed and well-nourished.  HENT:  Head: Normocephalic and atraumatic.  Cardiovascular: Normal rate and regular rhythm.   No murmur heard. Pulmonary/Chest: Effort normal and breath sounds normal. No respiratory distress.  Abdominal: Soft. There is no tenderness. There is no rebound and no guarding.  Musculoskeletal:  Moderate swelling to the right ankle with deformity present. There is a tiny abrasion to the anterior distal leg. 2+ DP pulses bilaterally. Toes are well perfused. Sensation light touch intact throughout the foot.  Neurological: She is alert and oriented to person, place, and time.  Skin: Skin is warm and dry.  Psychiatric: She has a normal mood and affect. Her behavior is normal.  Nursing note  and vitals reviewed.   ED Course  Reduction of fracture Date/Time: 06/01/2014 6:48 PM Performed by: Tilden Fossa Authorized by: Tilden Fossa Consent: Verbal consent obtained. Risks and benefits: risks, benefits and alternatives were discussed Consent given by: patient Patient understanding: patient states understanding of the procedure being performed Patient identity confirmed: verbally with patient and arm band Local anesthesia used: no Patient sedated: yes Sedatives: propofol Vitals: Vital signs were monitored during sedation. Patient tolerance: Patient tolerated the procedure well with no immediate complications   (including critical care time)  Procedural  sedation Performed by: Tilden Fossa Consent: Verbal consent obtained. Risks and benefits: risks, benefits and alternatives were discussed Required items: required blood products, implants, devices, and special equipment available Patient identity confirmed: arm band and provided demographic data Time out: Immediately prior to procedure a "time out" was called to verify the correct patient, procedure, equipment, support staff and site/side marked as required.  Sedation type: moderate (conscious) sedation NPO time confirmed and considedered  Sedatives: PROPOFOL  Physician Time at Bedside: 15 minutes  Vitals: Vital signs were monitored during sedation. Cardiac Monitor, pulse oximeter Patient tolerance: Patient tolerated the procedure well with no immediate complications. Comments: Pt with uneventful recovered. Returned to pre-procedural sedation baseline   SPLINT APPLICATION Date/Time: 6:48 PM Authorized by: Tilden Fossa Consent: Verbal consent obtained. Risks and benefits: risks, benefits and alternatives were discussed Consent given by: patient Splint applied by: orthopedic technician Location details: RLE Splint type:posterior stirrup Supplies used: plaster Post-procedure: The splinted body part was neurovascularly unchanged following the procedure. Patient tolerance: Patient tolerated the procedure well with no immediate complications.      Labs Review Labs Reviewed - No data to display  Imaging Review Dg Ankle 2 Views Right  06/01/2014   CLINICAL DATA:  Known ankle fracture  EXAM: RIGHT ANKLE - 2 VIEW  COMPARISON:  Film from earlier in the same day  FINDINGS: Casting material is now in place. The bimalleolar fracture has been predominately reduced. No acute abnormality is seen.  IMPRESSION: Reduction in the degree of dislocation seen on the prior exam. No new focal abnormality is seen.   Electronically Signed   By: Alcide Clever M.D.   On: 06/01/2014 19:57   Dg Ankle  Complete Right  06/01/2014   CLINICAL DATA:  Tripped in high heels shoes. Right ankle pain and swelling. Initial encounter.  EXAM: RIGHT ANKLE - COMPLETE 3+ VIEW  COMPARISON:  None.  FINDINGS: Bimalleolar ankle fracture is seen. There is lateral displacement of both medial and lateral malleolar fracture fragments, with lateral subluxation of the talus. Marked soft tissue swelling noted.  IMPRESSION: Bimalleolar ankle fracture, with lateral displacement of fracture fragments and lateral subluxation of the talus.   Electronically Signed   By: Myles Rosenthal M.D.   On: 06/01/2014 16:46     EKG Interpretation None      MDM   Final diagnoses:  Bimalleolar fracture, right, closed, initial encounter    Patient here for evaluation of ankle pain following a mechanical fall. She does have a bimalleolar fracture that was moderately displaced on exam. Discussed with Dr. Eulah Pont with orthopedics who recommends reduction with splinting and following up in the office on Monday. Patient tolerated reduction without difficulties. She did have some dizziness, nausea following her recovery and was observed in the emergency department for a few hours and given Zofran as well as a light snack. Patient is feeling improved on recheck plan to DC home with orthopedics follow-up.  Patient unable  to ambulate with crutches. Will consult social work in the morning for assistance with wheelchair versus rolling scooter at home.  Tilden Fossa, MD 06/01/14 4098  Tilden Fossa, MD 06/02/14 [phone]

## 2014-06-02 NOTE — ED Notes (Signed)
Report given to TCU.  

## 2014-06-02 NOTE — ED Notes (Signed)
Patient has spoken with case Production designer, theatre/television/film.  Case manager to bring knee roller to hospital for patient to take home.  Patient has ride home arranged.

## 2014-06-02 NOTE — Evaluation (Signed)
Physical Therapy Evaluation Patient Details Name: Beverly [name] MRN: [id number] DOB: 1950/09/20 Today's Date: 06/02/2014   History of Present Illness  pt in with bimaleleolar R ankle fracture. Assuming and educated as NWB (didn't see an order howerver) Pt is going to see ortho MD on Monday.   Clinical Impression  Educated pt with scooter and RW for she will need both for getting in small areas of her home and also for longer larger areas for distance and standing activities needing the scooter. Pt tolerated session well, may need HHPT if does not progress well at home, hopefully she can communicate this to the MD Monday if need for she is discharging at this time.     Follow Up Recommendations Home health PT    Equipment Recommendations  Rolling walker with 5" wheels (and knee scooter)    Recommendations for Other Services       Precautions / Restrictions Precautions Required Braces or Orthoses:  (splint on R ankle) Restrictions Weight Bearing Restrictions:  (assuming NWB R ankle)      Mobility  Bed Mobility Overal bed mobility: Independent                Transfers Overall transfer level: Modified independent               General transfer comment: educated on how to safely transfer and stand with RW and with knee scooter  Ambulation/Gait Ambulation/Gait assistance: Supervision Ambulation Distance (Feet): 30 Feet Assistive device: Rolling walker (2 wheeled) (also walked with knee scooter as well)       General Gait Details: educated with RW for smaller areas in hr home and the knee scooter for longer distances and standing in order to make pt more mobile  Stairs Stairs: Yes       General stair comments: talked alot with demo and pt understanding how to go up her 3 steps with hopping and using the rail OR by sitting down on her bottom gently and scooting up tot he top step. I talked her through it and simulated demo for her as well. I asked if she  would like to practive on our practice steps, and at this time she politely declined thinking she would figure it out and be okay as well.   Wheelchair Mobility    Modified Rankin (Stroke Patients Only)       Balance                                             Pertinent Vitals/Pain Pain Assessment: 0-10 Pain Score: 3  Pain Location: R ankle  Pain Descriptors / Indicators: Aching    Home Living Family/patient expects to be discharged to:: Private residence Living Arrangements: Parent (she lives with her elderly mother, who cares for herslef. ) Available Help at Discharge: Family;Friend(s) (she may have some help from family and friends. ) Type of Home: House Home Access: Stairs to enter Entrance Stairs-Rails: Right Entrance Stairs-Number of Steps: 3 Home Layout: One level Home Equipment: None Additional Comments: she was given B crutches , however unable to use them safely.     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      Hand Dominance        Extremity/Trunk Assessment  Lower Extremity Assessment: Overall WFL for tasks assessed         Communication   Communication: No difficulties  Cognition Arousal/Alertness: Awake/alert Behavior During Therapy: WFL for tasks assessed/performed Overall Cognitive Status: Within Functional Limits for tasks assessed                      General Comments      Exercises        Assessment/Plan    PT Assessment All further PT needs can be met in the next venue of care  PT Diagnosis Difficulty walking   PT Problem List    PT Treatment Interventions DME instruction;Gait training;Stair training;Functional mobility training;Therapeutic activities;Therapeutic exercise;Patient/family education   PT Goals (Current goals can be found in the Care Plan section) Acute Rehab PT Goals PT Goal Formulation: All assessment and  education complete, DC therapy    Frequency     Barriers to discharge        Co-evaluation               End of Session Equipment Utilized During Treatment: Gait belt Activity Tolerance: Patient tolerated treatment well Patient left: in chair      Functional Assessment Tool Used: clinical judgement Functional Limitation: Mobility: Walking and moving around Mobility: Walking and Moving Around Current Status (Y8016): At least 1 percent but less than 20 percent impaired, limited or restricted Mobility: Walking and Moving Around Goal Status [phone]): At least 1 percent but less than 20 percent impaired, limited or restricted Mobility: Walking and Moving Around Discharge Status [phone]): At least 1 percent but less than 20 percent impaired, limited or restricted    Time: 1430-1510 PT Time Calculation (min) (ACUTE ONLY): 40 min   Charges:   PT Evaluation $Initial PT Evaluation Tier I: 1 Procedure PT Treatments $Gait Training: 8-22 mins $Therapeutic Activity: 8-22 mins   PT G Codes:   PT G-Codes **NOT FOR INPATIENT CLASS** Functional Assessment Tool Used: clinical judgement Functional Limitation: Mobility: Walking and moving around Mobility: Walking and Moving Around Current Status (E6754): At least 1 percent but less than 20 percent impaired, limited or restricted Mobility: Walking and Moving Around Goal Status [phone]): At least 1 percent but less than 20 percent impaired, limited or restricted Mobility: Walking and Moving Around Discharge Status [phone]): At least 1 percent but less than 20 percent impaired, limited or restricted    Clide Dales 06/02/2014, 3:35 PM Clide Dales, PT Pager: [phone] 06/02/2014

## 2014-06-02 NOTE — ED Notes (Signed)
Attempted to ambulate pt with crutches, pt is complaining of upper body weakness, her gait is unsteady, after only few feet pt had to be helped back to bed. Dr Madilyn Hook made aware.

## 2014-06-02 NOTE — ED Notes (Signed)
PT here with walker for pt.

## 2014-06-02 NOTE — Progress Notes (Addendum)
CARE MANAGEMENT NOTE 06/02/2014  Patient:  DEADRE, SCHIMMING   Account Number:  [id number]  Date Initiated:  06/02/2014  Documentation initiated by:  [facility]  Subjective/Objective Assessment:   ankle fx     Action/Plan:   lives at home with elderly mother   Anticipated DC Date:  06/02/2014   Anticipated DC Plan:  HOME/SELF CARE      DC Planning Services  CM consult      Choice offered to / List presented to:     DME arranged  Levan Hurst      DME agency  [facility]        Status of service:  Completed, signed off Medicare Important Message given?   (If response is "NO", the following Medicare IM given date fields will be blank) Date Medicare IM given:   Medicare IM given by:   Date Additional Medicare IM given:   Additional Medicare IM given by:    Discharge Disposition:  HOME/SELF CARE  Per UR Regulation:    If discussed at Long Length of Stay Meetings, dates discussed:    Comments:  06/02/2014 1500 Spoke to PT and recommendations are for RW also for home. Contacted pt and she will use her insurance to pay for RW and have Worker's Comp reimburse her insurance. Contacted [facility] for RW for home. Isidoro Donning RN CCM Case Mgmt phone [phone]  06/02/2014 1200 NCM spoke to pt and states she is not able to use crutches and prefers a knee scooter. NCM explained that knee scooter is an out of pocket expense but will probably be covered by her worker's comp. NCM explained cannot guarantee benefit with Worker's Comp. States she does not have her contact for [employer] as this accident happened on Friday. She will see orthopedic surgeon on Monday. NCM contacted [facility] and they do not have item. She can order from their home store on Monday. Contacted [employer] and item has to be purchased online. NCM Contacted [employer] and they have item to rent or buy. Provided pt with info. Pt contacted [facility] and she paid to rent over the phone. Pt states  she did not have anyone that could pick up DME prior to the store closing at 1 pm. NCM picked up DME from [facility] and delivered to pt's room. ED RN will contact PT to eval for use of device. Pt will follow up with Worker's Comp. Pt appreciative of assistance with getting knee scooter today. Isidoro Donning RN CCM Case Mgmt phone [phone]

## 2014-06-02 NOTE — ED Notes (Signed)
Case manager arrived with knee roller.  PT consulted for instructions on use.

## 2014-06-02 NOTE — ED Notes (Signed)
PT instructing patient at bedside on use of knee roller.

## 2014-06-02 NOTE — ED Notes (Signed)
Pt alert x4, no c/o pain, v/s stable, awaiting Social Worker in the am. Will continue to monitor. Estill Dooms, RN 1:49 AM 06/02/2014

## 2014-06-02 NOTE — ED Notes (Signed)
Patient waiting for case manager to come to receive proper equipment for ambulation.  Not able to use crutches.

## 2014-06-04 NOTE — H&P (Signed)
Beverly [name] is an 64 y.o. female.   Chief Complaint: right ankle pain  HPI: Patient describes rolling her ankle on 06/01/14 while walking down the hallway.  She was taken to the [facility] and found to have an ankle fracture.  She was splinted and told to follow up with our office.  Patient has remained non-weight bearing of the RLE.  She denies numbness or tingling that radiates from the ankle into the foot.  Past Medical History  Diagnosis Date  . Lichen sclerosus   . Crohn's disease   . Osteopenia     Past Surgical History  Procedure Laterality Date  . Breast reduction surgery  2011  . Colectomy  2004    ileostomy    Family History  Problem Relation Age of Onset  . Uterine cancer Mother 44  . Colon cancer Mother 13  . Hypertension Mother   . Cancer Mother   . Cancer Father    Social History:  reports that she quit smoking about 13 years ago. Her smoking use included Cigarettes. She has never used smokeless tobacco. She reports that she drinks about 3.5 oz of alcohol per week. She reports that she does not use illicit drugs.  Allergies:  Allergies  Allergen Reactions  . Flagyl [Metronidazole]     Causes peripheral neuropathy    No prescriptions prior to admission    No results found for this or any previous visit (from the past 48 hour(s)). No results found.  Review of Systems  Constitutional: Negative for fever and chills.  HENT: Negative for ear pain and sore throat.   Eyes: Negative for blurred vision and double vision.  Respiratory: Negative for cough and wheezing.   Cardiovascular: Negative for chest pain and palpitations.  Gastrointestinal: Negative for nausea and vomiting.  Musculoskeletal:       Right ankle pain  Skin: Negative for rash.  Neurological: Negative for dizziness and seizures.  Psychiatric/Behavioral: Negative for depression and suicidal ideas.    There were no vitals taken for this visit. Physical Exam  Constitutional: She is oriented to  person, place, and time. She appears well-developed and well-nourished.  HENT:  Head: Normocephalic and atraumatic.  Eyes: Conjunctivae and EOM are normal. Pupils are equal, round, and reactive to light.  Neck: Normal range of motion. Neck supple.  Cardiovascular: Normal rate and intact distal pulses.   Respiratory: Effort normal and breath sounds normal.  GI: Soft. Bowel sounds are normal.  Musculoskeletal: She exhibits tenderness (right ankle medial and lateral mal).  Neurological: She is alert and oriented to person, place, and time.  Skin: Skin is warm and dry.  Psychiatric: She has a normal mood and affect. Her behavior is normal. Judgment and thought content normal.     Assessment/Plan ORIF of right bimalleolar ankle fracture  Beverly [name] 06/04/2014, 6:25 PM

## 2014-06-05 ENCOUNTER — Encounter (HOSPITAL_BASED_OUTPATIENT_CLINIC_OR_DEPARTMENT_OTHER): Payer: Self-pay | Admitting: *Deleted

## 2014-06-08 ENCOUNTER — Encounter (HOSPITAL_BASED_OUTPATIENT_CLINIC_OR_DEPARTMENT_OTHER)
Admission: RE | Disposition: A | Discharge status: Home / Self Care | Payer: Self-pay | Source: Ambulatory Visit | Attending: Orthopedic Surgery

## 2014-06-08 ENCOUNTER — Encounter (HOSPITAL_BASED_OUTPATIENT_CLINIC_OR_DEPARTMENT_OTHER): Payer: Self-pay | Admitting: Certified Registered"

## 2014-06-08 ENCOUNTER — Ambulatory Visit (HOSPITAL_BASED_OUTPATIENT_CLINIC_OR_DEPARTMENT_OTHER): Payer: Worker's Compensation | Admitting: Anesthesiology

## 2014-06-08 ENCOUNTER — Ambulatory Visit (HOSPITAL_BASED_OUTPATIENT_CLINIC_OR_DEPARTMENT_OTHER)
Admission: RE | Admit: 2014-06-08 | Discharge: 2014-06-09 | Disposition: A | Discharge status: Home / Self Care | Payer: Worker's Compensation | Source: Ambulatory Visit | Attending: Orthopedic Surgery | Admitting: Orthopedic Surgery

## 2014-06-08 DIAGNOSIS — Z87891 Personal history of nicotine dependence: Secondary | ICD-10-CM | POA: Diagnosis not present

## 2014-06-08 DIAGNOSIS — Y999 Unspecified external cause status: Secondary | ICD-10-CM | POA: Diagnosis not present

## 2014-06-08 DIAGNOSIS — Y9289 Other specified places as the place of occurrence of the external cause: Secondary | ICD-10-CM | POA: Insufficient documentation

## 2014-06-08 DIAGNOSIS — S82841A Displaced bimalleolar fracture of right lower leg, initial encounter for closed fracture: Secondary | ICD-10-CM | POA: Insufficient documentation

## 2014-06-08 DIAGNOSIS — X58XXXA Exposure to other specified factors, initial encounter: Secondary | ICD-10-CM | POA: Diagnosis not present

## 2014-06-08 DIAGNOSIS — Y9301 Activity, walking, marching and hiking: Secondary | ICD-10-CM | POA: Diagnosis not present

## 2014-06-08 DIAGNOSIS — S82899A Other fracture of unspecified lower leg, initial encounter for closed fracture: Secondary | ICD-10-CM | POA: Diagnosis present

## 2014-06-08 DIAGNOSIS — S82843A Displaced bimalleolar fracture of unspecified lower leg, initial encounter for closed fracture: Secondary | ICD-10-CM | POA: Diagnosis present

## 2014-06-08 DIAGNOSIS — M8580 Other specified disorders of bone density and structure, unspecified site: Secondary | ICD-10-CM | POA: Diagnosis not present

## 2014-06-08 HISTORY — DX: Dental restoration status: Z98.811

## 2014-06-08 HISTORY — DX: Nausea with vomiting, unspecified: R11.2

## 2014-06-08 HISTORY — DX: Ileostomy status: Z93.2

## 2014-06-08 HISTORY — PX: ORIF ANKLE FRACTURE: SHX5408

## 2014-06-08 HISTORY — DX: Displaced bimalleolar fracture of right lower leg, initial encounter for closed fracture: S82.841A

## 2014-06-08 HISTORY — DX: Family history of other specified conditions: Z84.89

## 2014-06-08 HISTORY — DX: Personal history of other diseases of the digestive system: Z87.19

## 2014-06-08 HISTORY — DX: Other specified postprocedural states: Z98.890

## 2014-06-08 LAB — POCT HEMOGLOBIN-HEMACUE: HEMOGLOBIN: 13.3 g/dL (ref 12.0–15.0)

## 2014-06-08 SURGERY — OPEN REDUCTION INTERNAL FIXATION (ORIF) ANKLE FRACTURE
Anesthesia: Regional | Site: Ankle | Laterality: Right

## 2014-06-08 MED ORDER — METOCLOPRAMIDE HCL 5 MG/ML IJ SOLN: 5.0000 mg | Freq: Three times a day (TID) | INTRAMUSCULAR | Status: DC | PRN

## 2014-06-08 MED ORDER — CEFAZOLIN SODIUM-DEXTROSE 2-3 GM-% IV SOLR
INTRAVENOUS | Status: AC
  Filled 2014-06-08: qty 50

## 2014-06-08 MED ORDER — PROPOFOL INFUSION 10 MG/ML OPTIME
INTRAVENOUS | Status: DC | PRN
  Administered 2014-06-08: 100 ug/kg/min via INTRAVENOUS

## 2014-06-08 MED ORDER — OXYCODONE HCL 5 MG/5ML PO SOLN: 5.0000 mg | Freq: Once | ORAL | Status: AC | PRN

## 2014-06-08 MED ORDER — FENTANYL CITRATE 0.05 MG/ML IJ SOLN
INTRAMUSCULAR | Status: AC
  Filled 2014-06-08: qty 2

## 2014-06-08 MED ORDER — SCOPOLAMINE 1 MG/3DAYS TD PT72
MEDICATED_PATCH | TRANSDERMAL | Status: AC
  Filled 2014-06-08: qty 1

## 2014-06-08 MED ORDER — ASPIRIN 325 MG PO TABS: 325.0000 mg | ORAL_TABLET | Freq: Every day | ORAL | Status: DC

## 2014-06-08 MED ORDER — BUPIVACAINE-EPINEPHRINE (PF) 0.5% -1:200000 IJ SOLN
INTRAMUSCULAR | Status: DC | PRN
  Administered 2014-06-08: 30 mL via PERINEURAL

## 2014-06-08 MED ORDER — MIDAZOLAM HCL 2 MG/ML PO SYRP: 12.0000 mg | ORAL_SOLUTION | Freq: Once | ORAL | Status: DC | PRN

## 2014-06-08 MED ORDER — HYDROMORPHONE HCL 1 MG/ML IJ SOLN: 0.2500 mg | INTRAMUSCULAR | Status: DC | PRN

## 2014-06-08 MED ORDER — FENTANYL CITRATE 0.05 MG/ML IJ SOLN
INTRAMUSCULAR | Status: AC
  Filled 2014-06-08: qty 6

## 2014-06-08 MED ORDER — DEXAMETHASONE SODIUM PHOSPHATE 10 MG/ML IJ SOLN
INTRAMUSCULAR | Status: DC | PRN
  Administered 2014-06-08: 10 mg via INTRAVENOUS

## 2014-06-08 MED ORDER — ONDANSETRON HCL 4 MG/2ML IJ SOLN
INTRAMUSCULAR | Status: DC | PRN
  Administered 2014-06-08: 4 mg via INTRAVENOUS

## 2014-06-08 MED ORDER — PROMETHAZINE HCL 25 MG/ML IJ SOLN
6.2500 mg | INTRAMUSCULAR | Status: DC | PRN
Start: 2014-06-08 — End: 2014-06-09

## 2014-06-08 MED ORDER — MIDAZOLAM HCL 2 MG/2ML IJ SOLN
1.0000 mg | INTRAMUSCULAR | Status: DC | PRN
  Administered 2014-06-08: 2 mg via INTRAVENOUS

## 2014-06-08 MED ORDER — HYDROCODONE-ACETAMINOPHEN 5-325 MG PO TABS: 1.0000 | ORAL_TABLET | ORAL | Status: DC | PRN

## 2014-06-08 MED ORDER — LACTATED RINGERS IV SOLN
INTRAVENOUS | Status: DC
Start: 2014-06-08 — End: 2014-06-08
  Administered 2014-06-08 (×2): via INTRAVENOUS

## 2014-06-08 MED ORDER — ONDANSETRON HCL 4 MG PO TABS: 4.0000 mg | ORAL_TABLET | Freq: Three times a day (TID) | ORAL | Status: DC | PRN

## 2014-06-08 MED ORDER — CEFAZOLIN SODIUM-DEXTROSE 2-3 GM-% IV SOLR
2.0000 g | Freq: Four times a day (QID) | INTRAVENOUS | Status: DC
  Administered 2014-06-08 – 2014-06-09 (×2): 2 g via INTRAVENOUS

## 2014-06-08 MED ORDER — ACETAMINOPHEN 325 MG PO TABS: 650.0000 mg | ORAL_TABLET | Freq: Four times a day (QID) | ORAL | Status: DC | PRN

## 2014-06-08 MED ORDER — ONDANSETRON HCL 4 MG PO TABS: 4.0000 mg | ORAL_TABLET | Freq: Four times a day (QID) | ORAL | Status: DC | PRN

## 2014-06-08 MED ORDER — MIDAZOLAM HCL 5 MG/5ML IJ SOLN
INTRAMUSCULAR | Status: DC | PRN
  Administered 2014-06-08 (×2): 1 mg via INTRAVENOUS

## 2014-06-08 MED ORDER — CHLORHEXIDINE GLUCONATE 4 % EX LIQD: 60.0000 mL | Freq: Once | CUTANEOUS | Status: DC

## 2014-06-08 MED ORDER — HYDROMORPHONE HCL 1 MG/ML IJ SOLN: 1.0000 mg | INTRAMUSCULAR | Status: DC | PRN

## 2014-06-08 MED ORDER — MIDAZOLAM HCL 2 MG/2ML IJ SOLN
INTRAMUSCULAR | Status: AC
  Filled 2014-06-08: qty 2

## 2014-06-08 MED ORDER — HYDROCODONE-ACETAMINOPHEN 5-325 MG PO TABS
1.0000 | ORAL_TABLET | Freq: Four times a day (QID) | ORAL | Status: DC | PRN
Start: 2014-06-08 — End: 2014-11-01

## 2014-06-08 MED ORDER — FENTANYL CITRATE 0.05 MG/ML IJ SOLN
50.0000 ug | INTRAMUSCULAR | Status: DC | PRN
  Administered 2014-06-08: 100 ug via INTRAVENOUS

## 2014-06-08 MED ORDER — POTASSIUM CHLORIDE IN NACL 20-0.45 MEQ/L-% IV SOLN: INTRAVENOUS | Status: DC

## 2014-06-08 MED ORDER — ACETAMINOPHEN 500 MG PO TABS
1000.0000 mg | ORAL_TABLET | Freq: Once | ORAL | Status: AC
  Administered 2014-06-08: 1000 mg via ORAL

## 2014-06-08 MED ORDER — CEFAZOLIN SODIUM-DEXTROSE 2-3 GM-% IV SOLR
2.0000 g | INTRAVENOUS | Status: AC
  Administered 2014-06-08: 2 g via INTRAVENOUS

## 2014-06-08 MED ORDER — OXYCODONE HCL 5 MG PO TABS: 5.0000 mg | ORAL_TABLET | Freq: Once | ORAL | Status: AC | PRN

## 2014-06-08 MED ORDER — SODIUM CHLORIDE 0.9 % IV SOLN
INTRAVENOUS | Status: AC
  Administered 2014-06-08: 18:00:00 via INTRAVENOUS

## 2014-06-08 MED ORDER — DOCUSATE SODIUM 100 MG PO CAPS: 100.0000 mg | ORAL_CAPSULE | Freq: Two times a day (BID) | ORAL | Status: DC

## 2014-06-08 MED ORDER — ACETAMINOPHEN 650 MG RE SUPP: 650.0000 mg | Freq: Four times a day (QID) | RECTAL | Status: DC | PRN

## 2014-06-08 MED ORDER — PROPOFOL 10 MG/ML IV BOLUS
INTRAVENOUS | Status: DC | PRN
  Administered 2014-06-08: 30 mg via INTRAVENOUS
  Administered 2014-06-08: 200 mg via INTRAVENOUS
  Administered 2014-06-08: 50 mg via INTRAVENOUS

## 2014-06-08 MED ORDER — ACETAMINOPHEN 500 MG PO TABS
ORAL_TABLET | ORAL | Status: AC
  Filled 2014-06-08: qty 2

## 2014-06-08 MED ORDER — METOCLOPRAMIDE HCL 5 MG PO TABS: 5.0000 mg | ORAL_TABLET | Freq: Three times a day (TID) | ORAL | Status: DC | PRN

## 2014-06-08 MED ORDER — ONDANSETRON HCL 4 MG/2ML IJ SOLN: 4.0000 mg | Freq: Four times a day (QID) | INTRAMUSCULAR | Status: DC | PRN

## 2014-06-08 SURGICAL SUPPLY — 83 items
4.0mm cannulated screw ×3 IMPLANT
BANDAGE ELASTIC 4 VELCRO ST LF (GAUZE/BANDAGES/DRESSINGS) ×3 IMPLANT
BANDAGE ELASTIC 6 VELCRO ST LF (GAUZE/BANDAGES/DRESSINGS) ×3 IMPLANT
BANDAGE ESMARK 6X9 LF (GAUZE/BANDAGES/DRESSINGS) ×1 IMPLANT
BIT DRILL 2.5X125 (BIT) ×3 IMPLANT
BIT DRILL 3.5X125 (BIT) ×1 IMPLANT
BIT DRILL CANN 2.7 (BIT) ×1
BIT DRILL CANN 2.7MM (BIT) ×1
BIT DRILL SRG 2.7XCANN AO CPLG (BIT) ×1 IMPLANT
BIT DRL SRG 2.7XCANN AO CPLNG (BIT) ×1
BLADE SURG 15 STRL LF DISP TIS (BLADE) ×2 IMPLANT
BLADE SURG 15 STRL SS (BLADE) ×4
BNDG COHESIVE 4X5 TAN STRL (GAUZE/BANDAGES/DRESSINGS) ×3 IMPLANT
BNDG ESMARK 6X9 LF (GAUZE/BANDAGES/DRESSINGS) ×3
CHLORAPREP W/TINT 26ML (MISCELLANEOUS) ×3 IMPLANT
CLOSURE STERI-STRIP 1/2X4 (GAUZE/BANDAGES/DRESSINGS) ×2
CLSR STERI-STRIP ANTIMIC 1/2X4 (GAUZE/BANDAGES/DRESSINGS) ×4 IMPLANT
COVER BACK TABLE 60X90IN (DRAPES) ×3 IMPLANT
CUFF TOURNIQUET SINGLE 24IN (TOURNIQUET CUFF) IMPLANT
CUFF TOURNIQUET SINGLE 34IN LL (TOURNIQUET CUFF) ×3 IMPLANT
DECANTER SPIKE VIAL GLASS SM (MISCELLANEOUS) IMPLANT
DRAPE EXTREMITY T 121X128X90 (DRAPE) ×3 IMPLANT
DRAPE OEC MINIVIEW 54X84 (DRAPES) ×3 IMPLANT
DRAPE U 20/CS (DRAPES) ×3 IMPLANT
DRAPE U-SHAPE 47X51 STRL (DRAPES) ×3 IMPLANT
DRILL BIT 3.5X125 (BIT) ×2
DRSG EMULSION OIL 3X3 NADH (GAUZE/BANDAGES/DRESSINGS) ×3 IMPLANT
DRSG PAD ABDOMINAL 8X10 ST (GAUZE/BANDAGES/DRESSINGS) ×3 IMPLANT
ELECT REM PT RETURN 9FT ADLT (ELECTROSURGICAL) ×3
ELECTRODE REM PT RTRN 9FT ADLT (ELECTROSURGICAL) ×1 IMPLANT
GAUZE SPONGE 4X4 12PLY STRL (GAUZE/BANDAGES/DRESSINGS) ×3 IMPLANT
GLOVE BIO SURGEON STRL SZ7 (GLOVE) ×3 IMPLANT
GLOVE BIO SURGEON STRL SZ7.5 (GLOVE) ×3 IMPLANT
GLOVE BIO SURGEON STRL SZ8 (GLOVE) IMPLANT
GLOVE BIOGEL PI IND STRL 7.0 (GLOVE) ×3 IMPLANT
GLOVE BIOGEL PI IND STRL 8 (GLOVE) ×1 IMPLANT
GLOVE BIOGEL PI INDICATOR 7.0 (GLOVE) ×6
GLOVE BIOGEL PI INDICATOR 8 (GLOVE) ×2
GLOVE ECLIPSE 6.5 STRL STRAW (GLOVE) ×3 IMPLANT
GOWN STRL REUS W/ TWL LRG LVL3 (GOWN DISPOSABLE) ×3 IMPLANT
GOWN STRL REUS W/ TWL XL LVL3 (GOWN DISPOSABLE) IMPLANT
GOWN STRL REUS W/TWL LRG LVL3 (GOWN DISPOSABLE) ×6
GOWN STRL REUS W/TWL XL LVL3 (GOWN DISPOSABLE)
K-WIRE ORTHOPEDIC 1.4X150L (WIRE) ×6
KWIRE ORTHOPEDIC 1.4X150L (WIRE) ×2 IMPLANT
NEEDLE HYPO 22GX1.5 SAFETY (NEEDLE) IMPLANT
NS IRRIG 1000ML POUR BTL (IV SOLUTION) ×3 IMPLANT
PACK BASIN DAY SURGERY FS (CUSTOM PROCEDURE TRAY) ×3 IMPLANT
PAD CAST 4YDX4 CTTN HI CHSV (CAST SUPPLIES) ×1 IMPLANT
PADDING CAST ABS 4INX4YD NS (CAST SUPPLIES) ×4
PADDING CAST ABS COTTON 4X4 ST (CAST SUPPLIES) ×2 IMPLANT
PADDING CAST COTTON 4X4 STRL (CAST SUPPLIES) ×2
PADDING CAST COTTON 6X4 STRL (CAST SUPPLIES) ×3 IMPLANT
PENCIL BUTTON HOLSTER BLD 10FT (ELECTRODE) ×3 IMPLANT
PLATE TUBUAL 1/3 6H (Plate) ×3 IMPLANT
SCREW CANC FT 16X4X2.5XHEX (Screw) ×1 IMPLANT
SCREW CANCELLOUS 4.0X14 (Screw) ×3 IMPLANT
SCREW CANCELLOUS 4.0X16MM (Screw) ×2 IMPLANT
SCREW CANCELLOUS FT 4.0X18MM (Screw) ×3 IMPLANT
SCREW CANN 44X15X4XSLF DRL (Screw) ×2 IMPLANT
SCREW CANNULATED 4.0X44MM (Screw) ×4 IMPLANT
SCREW CORTEX ST MATTA 3.5X12MM (Screw) ×6 IMPLANT
SCREW CORTEX ST MATTA 3.5X14 (Screw) ×6 IMPLANT
SCREW CORTEX ST MATTA 3.5X16MM (Screw) ×3 IMPLANT
SCREW CORTEX ST MATTA 3.5X20 (Screw) ×3 IMPLANT
SCREW CORTEX ST MATTA 3.5X26MM (Screw) ×3 IMPLANT
SLEEVE SCD COMPRESS KNEE MED (MISCELLANEOUS) ×3 IMPLANT
SPLINT FAST PLASTER 5X30 (CAST SUPPLIES) ×40
SPLINT PLASTER CAST FAST 5X30 (CAST SUPPLIES) ×20 IMPLANT
SPONGE LAP 4X18 X RAY DECT (DISPOSABLE) ×3 IMPLANT
SUCTION FRAZIER TIP 10 FR DISP (SUCTIONS) IMPLANT
SUT MNCRL AB 4-0 PS2 18 (SUTURE) ×3 IMPLANT
SUT MON AB 2-0 CT1 36 (SUTURE) ×3 IMPLANT
SUT VIC AB 0 SH 27 (SUTURE) ×3 IMPLANT
SUT VIC AB 2-0 SH 27 (SUTURE)
SUT VIC AB 2-0 SH 27XBRD (SUTURE) IMPLANT
SYR BULB 3OZ (MISCELLANEOUS) ×3 IMPLANT
SYR CONTROL 10ML LL (SYRINGE) IMPLANT
TOWEL OR 17X24 6PK STRL BLUE (TOWEL DISPOSABLE) ×6 IMPLANT
TOWEL OR NON WOVEN STRL DISP B (DISPOSABLE) ×3 IMPLANT
TUBE CONNECTING 20'X1/4 (TUBING) ×1
TUBE CONNECTING 20X1/4 (TUBING) ×2 IMPLANT
UNDERPAD 30X30 INCONTINENT (UNDERPADS AND DIAPERS) ×3 IMPLANT

## 2014-06-08 NOTE — Anesthesia Preprocedure Evaluation (Addendum)
Anesthesia Evaluation  Patient identified by MRN, date of birth, ID band Patient awake    Reviewed: Allergy & Precautions, NPO status , Patient's Chart, lab work & pertinent test results  History of Anesthesia Complications (+) PONV and history of anesthetic complications  Airway Mallampati: II  TM Distance: >3 FB Neck ROM: Full    Dental  (+) Teeth Intact, Dental Advisory Given   Pulmonary neg pulmonary ROS, former smoker,    Pulmonary exam normal       Cardiovascular negative cardio ROS      Neuro/Psych negative neurological ROS  negative psych ROS   GI/Hepatic negative GI ROS, Neg liver ROS,   Endo/Other  negative endocrine ROS  Renal/GU negative Renal ROS  negative genitourinary   Musculoskeletal   Abdominal   Peds  Hematology   Anesthesia Other Findings   Reproductive/Obstetrics                            Anesthesia Physical Anesthesia Plan  ASA: II  Anesthesia Plan: General   Post-op Pain Management:    Induction: Intravenous  Airway Management Planned: LMA  Additional Equipment:   Intra-op Plan:   Post-operative Plan: Extubation in OR  Informed Consent: I have reviewed the patients History and Physical, chart, labs and discussed the procedure including the risks, benefits and alternatives for the proposed anesthesia with the patient or authorized representative who has indicated his/her understanding and acceptance.   Dental advisory given  Plan Discussed with: CRNA, Anesthesiologist and Surgeon  Anesthesia Plan Comments:        Anesthesia Quick Evaluation

## 2014-06-08 NOTE — Transfer of Care (Signed)
Immediate Anesthesia Transfer of Care Note  Patient: Beverly [name]  Procedure(s) Performed: Procedure(s): OPEN REDUCTION INTERNAL FIXATION (ORIF) RIGHT BIMALLEOLAR ANKLE FRACTURE (Right)  Patient Location: PACU  Anesthesia Type:GA combined with regional for post-op pain  Level of Consciousness: awake, alert , oriented and patient cooperative  Airway & Oxygen Therapy: Patient Spontanous Breathing and Patient connected to face mask oxygen  Post-op Assessment: Report given to RN, Post -op Vital signs reviewed and stable and Patient moving all extremities  Post vital signs: Reviewed and stable  Last Vitals: There were no vitals filed for this visit.  Complications: No apparent anesthesia complications

## 2014-06-08 NOTE — Progress Notes (Signed)
Assisted Dr. Singer with right, ultrasound guided, popliteal/saphenous block. Side rails up, monitors on throughout procedure. See vital signs in flow sheet. Tolerated Procedure well. 

## 2014-06-08 NOTE — Anesthesia Postprocedure Evaluation (Signed)
Anesthesia Post Note  Patient: Beverly [name]  Procedure(s) Performed: Procedure(s) (LRB): OPEN REDUCTION INTERNAL FIXATION (ORIF) RIGHT BIMALLEOLAR ANKLE FRACTURE (Right)  Anesthesia type: general  Patient location: PACU  Post pain: Pain level controlled  Post assessment: Patient's Cardiovascular Status Stable  Last Vitals:  Filed Vitals:   06/08/14 1745  BP: 106/60  Pulse: 49  Temp:   Resp: 14    Post vital signs: Reviewed and stable  Level of consciousness: sedated  Complications: No apparent anesthesia complications

## 2014-06-08 NOTE — Anesthesia Procedure Notes (Addendum)
Anesthesia Regional Block:  Popliteal block  Pre-Anesthetic Checklist: ,, timeout performed, Correct Patient, Correct Site, Correct Laterality, Correct Procedure, Correct Position, site marked, Risks and benefits discussed,  Surgical consent,  Pre-op evaluation,  At surgeon's request and post-op pain management  Laterality: Right  Prep: chloraprep       Needles:  Injection technique: Single-shot  Needle Type: Echogenic Stimulator Needle          Additional Needles:  Procedures: ultrasound guided (picture in chart) and nerve stimulator Popliteal block  Nerve Stimulator or Paresthesia:  Response: plantar flexion, 0.45 mA,   Additional Responses:   Narrative:  Start time: 06/08/2014 2:09 PM End time: 06/08/2014 2:19 PM Injection made incrementally with aspirations every 5 mL.  Performed by: Personally  Anesthesiologist: Heather Roberts  Additional Notes: A functioning IV was confirmed and monitors were applied.  Sterile prep and drape, hand hygiene and sterile gloves were used.  Negative aspiration and test dose prior to incremental administration of local anesthetic. The patient tolerated the procedure well.Ultrasound  guidance: relevant anatomy identified, needle position confirmed, local anesthetic spread visualized around nerve(s), vascular puncture avoided.  Image printed for medical record. Adductor canal/Saphenous block also performed.   Procedure Name: LMA Insertion Date/Time: 06/08/2014 3:06 PM Performed by: Curly [place] Pre-anesthesia Checklist: Patient identified, Emergency Drugs available, Suction available and Patient being monitored Patient Re-evaluated:Patient Re-evaluated prior to inductionOxygen Delivery Method: Circle System Utilized Preoxygenation: Pre-oxygenation with 100% oxygen Intubation Type: IV induction Ventilation: Mask ventilation without difficulty LMA: LMA inserted LMA Size: 4.0 Number of attempts: 1 Airway Equipment and Method: Bite  block Placement Confirmation: positive ETCO2 and breath sounds checked- equal and bilateral Tube secured with: Tape Dental Injury: Teeth and Oropharynx as per pre-operative assessment

## 2014-06-08 NOTE — Discharge Instructions (Signed)
Keep splint clean and dry till follow up  Non-weight bearing in the right leg  ASA 325mg  daily for 3 weeks post op for DVT prophylaxis   Post Anesthesia Home Care Instructions  Activity: Get plenty of rest for the remainder of the day. A responsible adult should stay with you for 24 hours following the procedure.  For the next 24 hours, DO NOT: -Drive a car -Advertising copywriter -Drink alcoholic beverages -Take any medication unless instructed by your physician -Make any legal decisions or sign important papers.  Meals: Start with liquid foods such as gelatin or soup. Progress to regular foods as tolerated. Avoid greasy, spicy, heavy foods. If nausea and/or vomiting occur, drink only clear liquids until the nausea and/or vomiting subsides. Call your physician if vomiting continues.  Special Instructions/Symptoms: Your throat may feel dry or sore from the anesthesia or the breathing tube placed in your throat during surgery. If this causes discomfort, gargle with warm salt water. The discomfort should disappear within 24 hours.  If you had a scopolamine patch placed behind your ear for the management of post- operative nausea and/or vomiting:  1. The medication in the patch is effective for 72 hours, after which it should be removed.  Wrap patch in a tissue and discard in the trash. Wash hands thoroughly with soap and water. 2. You may remove the patch earlier than 72 hours if you experience unpleasant side effects which may include dry mouth, dizziness or visual disturbances. 3. Avoid touching the patch. Wash your hands with soap and water after contact with the patch.     Regional Anesthesia Blocks  1. Numbness or the inability to move the "blocked" extremity may last from 3-48 hours after placement. The length of time depends on the medication injected and your individual response to the medication. If the numbness is not going away after 48 hours, call your surgeon.  2. The  extremity that is blocked will need to be protected until the numbness is gone and the  Strength has returned. Because you cannot feel it, you will need to take extra care to avoid injury. Because it may be weak, you may have difficulty moving it or using it. You may not know what position it is in without looking at it while the block is in effect.  3. For blocks in the legs and feet, returning to weight bearing and walking needs to be done carefully. You will need to wait until the numbness is entirely gone and the strength has returned. You should be able to move your leg and foot normally before you try and bear weight or walk. You will need someone to be with you when you first try to ensure you do not fall and possibly risk injury.  4. Bruising and tenderness at the needle site are common side effects and will resolve in a few days.  5. Persistent numbness or new problems with movement should be communicated to the surgeon or the [facility] [phone] [facility] [phone]).

## 2014-06-08 NOTE — Op Note (Signed)
06/08/2014  4:04 PM  PATIENT:  Beverly [name]    [name]:  displaced bimalleolar fracture of unspecified lower leg, inital encounter for closed fracture  S82.843A  POST-OPERATIVE DIAGNOSIS:  Same  PROCEDURE:  OPEN REDUCTION INTERNAL FIXATION (ORIF) RIGHT BIMALLEOLAR ANKLE FRACTURE  SURGEON:  Almond Fitzgibbon, Jewel Baize, MD  ASSISTANT: Janalee Dane, PA-C, She was present and scrubbed throughout the case, critical for completion in a timely fashion, and for retraction, instrumentation, and closure.   ANESTHESIA:   gen   PREOPERATIVE INDICATIONS:  CHASITY OUTTEN is a  64 y.o. female with a diagnosis of displaced bimalleolar fracture of unspecified lower leg, inital encounter for closed fracture  S82.843A who failed conservative measures and elected for surgical management.    The risks benefits and alternatives were discussed with the patient preoperatively including but not limited to the risks of infection, bleeding, nerve injury, cardiopulmonary complications, the need for revision surgery, among others, and the patient was willing to proceed.  OPERATIVE IMPLANTS: 1/3 tubular plate, cannulated screws  OPERATIVE FINDINGS: Unstable ankle fracture. Stable syndesmosis post op  BLOOD LOSS: min  COMPLICATIONS: none  TOURNIQUET TIME: 60  OPERATIVE PROCEDURE:  Patient was identified in the preoperative holding area and site was marked by me He was transported to the operating theater and placed on the table in supine position taking care to pad all bony prominences. After a preincinduction time out anesthesia was induced. The right lower extremity was prepped and draped in normal sterile fashion and a pre-incision timeout was performed. Beverly [name] received ancef for preoperative antibiotics.   I made a lateral incision of roughly 7 cm dissection was carried down sharply to the distal fibula and then spreading dissection was used proximally to protect the superficial  peroneal nerve. I sharply incised the periosteum and took care to protect the peroneal tendons. I then debrided the fracture site and performed a reduction maneuver which was held in place with a clamp.   I placed a lag screw across the fracture  I then selected a 6-hole one third tubular plate and placed in a neutralization fashion care was taken distally so as not to penetrate the joint with the cancellus screws.  I then turned my attention medially where I created a 4 cm incision and dissected sharply down to the medial Mal fracture taking care to protect the saphenous vein. I debrided the fracture and reduced and held in place with a tenaculum. I then drilled and placed 2 partially threaded 44, 34 mm cannulated screws one anterior and one posterior across the fracture.  I then stressed the syndesmosis and it was stable  The wound was then thoroughly irrigated and closed using a 0 Vicryl and absorbable Monocryl sutures. He was placed in a short leg splint.   POST OPERATIVE PLAN: Non-weightbearing. DVT prophylaxis will consist of ASA 325  This note was generated using a template and dragon dictation system. In light of that, I have reviewed the note and all aspects of it are applicable to this case. Any dictation errors are due to the computerized dictation system.

## 2014-06-08 NOTE — Interval H&P Note (Signed)
History and Physical Interval Note:  06/08/2014 7:20 AM  Beverly [name]  has presented today for surgery, with the diagnosis of displaced bimalleolar fracture of unspecified lower leg, inital encounter for closed fracture  S82.843A  The various methods of treatment have been discussed with the patient and family. After consideration of risks, benefits and other options for treatment, the patient has consented to  Procedure(s): OPEN REDUCTION INTERNAL FIXATION (ORIF) RIGHT BIMALLEOLAR ANKLE FRACTURE (Right) as a surgical intervention .  The patient's history has been reviewed, patient examined, no change in status, stable for surgery.  I have reviewed the patient's chart and labs.  Questions were answered to the patient's satisfaction.     [name]

## 2014-06-09 DIAGNOSIS — S82841A Displaced bimalleolar fracture of right lower leg, initial encounter for closed fracture: Secondary | ICD-10-CM | POA: Diagnosis not present

## 2014-06-11 ENCOUNTER — Encounter (HOSPITAL_BASED_OUTPATIENT_CLINIC_OR_DEPARTMENT_OTHER): Payer: Self-pay | Admitting: Orthopedic Surgery

## 2014-08-10 ENCOUNTER — Other Ambulatory Visit: Payer: Self-pay | Admitting: Internal Medicine

## 2014-08-13 ENCOUNTER — Ambulatory Visit
Admission: RE | Admit: 2014-08-13 | Discharge: 2014-08-13 | Disposition: A | Discharge status: Home / Self Care | Payer: Medicare Other | Source: Ambulatory Visit | Attending: Internal Medicine | Admitting: Internal Medicine

## 2014-08-13 DIAGNOSIS — Z0000 Encounter for general adult medical examination without abnormal findings: Secondary | ICD-10-CM

## 2014-08-14 ENCOUNTER — Telehealth: Payer: Self-pay | Admitting: *Deleted

## 2014-08-14 NOTE — Telephone Encounter (Signed)
Spoke with patient she has an appt to see Dr Cleophas Dunker regarding her Bone Density results.

## 2014-10-23 ENCOUNTER — Other Ambulatory Visit: Payer: BLUE CROSS/BLUE SHIELD | Admitting: Internal Medicine

## 2014-10-23 DIAGNOSIS — Z13220 Encounter for screening for lipoid disorders: Secondary | ICD-10-CM

## 2014-10-23 DIAGNOSIS — Z1321 Encounter for screening for nutritional disorder: Secondary | ICD-10-CM

## 2014-10-23 DIAGNOSIS — Z0000 Encounter for general adult medical examination without abnormal findings: Secondary | ICD-10-CM

## 2014-10-23 DIAGNOSIS — Z130 Encounter for screening for diseases of the blood and blood-forming organs and certain disorders involving the immune mechanism: Secondary | ICD-10-CM

## 2014-10-23 DIAGNOSIS — Z1329 Encounter for screening for other suspected endocrine disorder: Secondary | ICD-10-CM

## 2014-10-23 LAB — LIPID PANEL
CHOL/HDL RATIO: 1.8 ratio (ref <=5.0)
CHOLESTEROL: 157 mg/dL (ref 125–200)
HDL: 85 mg/dL (ref >=46)
LDL Cholesterol: 62 mg/dL (ref <130)
Triglycerides: 52 mg/dL (ref <150)
VLDL: 10 mg/dL (ref <30)

## 2014-10-23 LAB — CBC WITH DIFFERENTIAL/PLATELET
BASOS PCT: 1 % (ref 0–1)
Basophils Absolute: 0.1 10*3/uL (ref 0.0–0.1)
Eosinophils Absolute: 0.4 10*3/uL (ref 0.0–0.7)
Eosinophils Relative: 6 % — ABNORMAL HIGH (ref 0–5)
HCT: 41.2 % (ref 36.0–46.0)
HEMOGLOBIN: 13.8 g/dL (ref 12.0–15.0)
LYMPHS PCT: 18 % (ref 12–46)
Lymphs Abs: 1.2 10*3/uL (ref 0.7–4.0)
MCH: 32.5 pg (ref 26.0–34.0)
MCHC: 33.5 g/dL (ref 30.0–36.0)
MCV: 97.2 fL (ref 78.0–100.0)
MPV: 11 fL (ref 8.6–12.4)
Monocytes Absolute: 0.7 10*3/uL (ref 0.1–1.0)
Monocytes Relative: 10 % (ref 3–12)
NEUTROS ABS: 4.3 10*3/uL (ref 1.7–7.7)
NEUTROS PCT: 65 % (ref 43–77)
Platelets: 263 10*3/uL (ref 150–400)
RBC: 4.24 MIL/uL (ref 3.87–5.11)
RDW: 13.4 % (ref 11.5–15.5)
WBC: 6.6 10*3/uL (ref 4.0–10.5)

## 2014-10-23 LAB — TSH: TSH: 2.687 u[IU]/mL (ref 0.350–4.500)

## 2014-10-23 LAB — COMPLETE METABOLIC PANEL WITH GFR
ALBUMIN: 4 g/dL (ref 3.6–5.1)
ALK PHOS: 110 U/L (ref 33–130)
ALT: 36 U/L — AB (ref 6–29)
AST: 35 U/L (ref 10–35)
BILIRUBIN TOTAL: 0.7 mg/dL (ref 0.2–1.2)
BUN: 13 mg/dL (ref 7–25)
CALCIUM: 9.3 mg/dL (ref 8.6–10.4)
CO2: 27 mmol/L (ref 20–31)
CREATININE: 0.76 mg/dL (ref 0.50–0.99)
Chloride: 106 mmol/L (ref 98–110)
GFR, Est African American: >89 mL/min (ref >=60)
GFR, Est Non African American: 83 mL/min (ref >=60)
Glucose, Bld: 95 mg/dL (ref 65–99)
Potassium: 4.8 mmol/L (ref 3.5–5.3)
SODIUM: 142 mmol/L (ref 135–146)
TOTAL PROTEIN: 6.3 g/dL (ref 6.1–8.1)

## 2014-10-24 LAB — VITAMIN D 25 HYDROXY (VIT D DEFICIENCY, FRACTURES): Vit D, 25-Hydroxy: 29 ng/mL — ABNORMAL LOW (ref 30–100)

## 2014-10-30 ENCOUNTER — Other Ambulatory Visit: Payer: Self-pay | Admitting: Internal Medicine

## 2014-11-01 ENCOUNTER — Ambulatory Visit (INDEPENDENT_AMBULATORY_CARE_PROVIDER_SITE_OTHER): Payer: BLUE CROSS/BLUE SHIELD | Admitting: Internal Medicine

## 2014-11-01 ENCOUNTER — Encounter: Payer: Self-pay | Admitting: Internal Medicine

## 2014-11-01 VITALS — BP 124/72 | HR 62 | Temp 97.9°F | Ht 66.0 in | Wt 182.5 lb

## 2014-11-01 DIAGNOSIS — E559 Vitamin D deficiency, unspecified: Secondary | ICD-10-CM | POA: Diagnosis not present

## 2014-11-01 DIAGNOSIS — R7302 Impaired glucose tolerance (oral): Secondary | ICD-10-CM

## 2014-11-01 DIAGNOSIS — K5090 Crohn's disease, unspecified, without complications: Secondary | ICD-10-CM | POA: Diagnosis not present

## 2014-11-01 DIAGNOSIS — R748 Abnormal levels of other serum enzymes: Secondary | ICD-10-CM | POA: Diagnosis not present

## 2014-11-01 DIAGNOSIS — M8580 Other specified disorders of bone density and structure, unspecified site: Secondary | ICD-10-CM

## 2014-11-01 DIAGNOSIS — Z0000 Encounter for general adult medical examination without abnormal findings: Secondary | ICD-10-CM | POA: Diagnosis not present

## 2014-11-01 DIAGNOSIS — Z9889 Other specified postprocedural states: Secondary | ICD-10-CM

## 2014-11-01 LAB — POCT URINALYSIS DIPSTICK
BILIRUBIN UA: NEGATIVE
Blood, UA: NEGATIVE
Glucose, UA: NEGATIVE
KETONES UA: NEGATIVE
LEUKOCYTES UA: NEGATIVE
Nitrite, UA: NEGATIVE
PH UA: 6
Protein, UA: NEGATIVE
Urobilinogen, UA: NEGATIVE

## 2014-11-25 NOTE — Progress Notes (Signed)
Subjective:    Patient ID: Beverly [name], female    DOB: 1950-06-20, 64 y.o.   MRN: [id number]  HPI  64 year old White Female in today for health maintenance exam. She has a history of Crohn's disease, vitamin D deficiency, elevated liver function test, osteoporosis, impaired glucose tolerance. History of bilateral breast reduction. History of lichen sclerosus.  She is intolerant of Flagyl because she says it causes numbness in her feet.  GYN is [facility].  She used to take Fosamax but quit in 2008. In April 2005 [place]. LS spine T score was -2.0 and right hip T score was -2.6. This was consistent with osteoporosis of the right hip. Study in 2010 showed some improvement in bone density right hip and lumbar spine. Study in 2013 showed T score LS spine -1.5 and in femur of -2.3. Repeat bone density study June 2016 showed a T score in the right hip of -2.0 and LS spine of -1.4. Has suggested we repeat bone density study in 1-2 years.   Initially was treated with Imuran for Crohn's disease beginning in 2004 but has been off this medication since approximate 2012. She's followed by Dr. Ewing Schlein  for inflammatory bowel disease. Crohn's disease was diagnosed in 1984. She had  sigmoidoscopy with Dr. Ewing Schlein July 2010. She had colectomy in March 2004 for inflammatory bowel disease.  Stress fracture right foot October 2008. Surgery for strabismus in childhood and also on both eyes in 1989. Bilateral tubal ligation 1984.  Family history: Father died at age 43 with history of prostate cancer. One sister with history of fibromyalgia and prediabetes. 2 adult sons. Mother with history of endometrial and colon cancer as well as hypothyroidism and hypertension. Mother is living, age 27 .  Social history: Has Masters degree in a JD degree. She lives with her son. She is divorced. Does not smoke. She quit smoking in 2002. Social alcohol consumption consisting of  wine. She did consume up to 3 glasses a night on wine to relax but with elevated liver functions I have asked her to cut back which she has done    Review of Systems overall doing well. Some situational stress at work.     Objective:   Physical Exam  Constitutional: She appears well-developed and well-nourished.  HENT:  Head: Normocephalic and atraumatic.  Right Ear: External ear normal.  Left Ear: External ear normal.  Mouth/Throat: Oropharynx is clear and moist. No oropharyngeal exudate.  Eyes: Conjunctivae and EOM are normal. Pupils are equal, round, and reactive to light. Right eye exhibits no discharge. Left eye exhibits no discharge. No scleral icterus.  Neck: Neck supple. No JVD present. No thyromegaly present.  Cardiovascular: Normal rate, regular rhythm and normal heart sounds.   No murmur heard. Pulmonary/Chest: Effort normal and breath sounds normal. No respiratory distress. She has no wheezes. She has no rales. She exhibits no tenderness.  Status post bilateral breast reduction  Abdominal: Bowel sounds are normal. She exhibits no distension and no mass. There is no tenderness. There is no rebound and no guarding.  Genitourinary:  Deferred to GYN  Musculoskeletal: She exhibits no edema.  Lymphadenopathy:    She has no cervical adenopathy.  Skin: Skin is warm and dry. No rash noted.  Psychiatric: She has a normal mood and affect. Her behavior is normal. Judgment and thought content normal.  Vitals reviewed.         Assessment & Plan:  Osteopenia-improvement in bone density study done June 2016 compared to 2013. Continue to follow.  Crohn's disease- status post colectomy and treatment with Imuran which was discontinued approximately  2012- followed by Dr. Ewing Schlein  History of elevated liver functions-thought to be due to wine consumption. Very mild elevation of SGPT. Continue to follow.  Vitamin D deficiency-recommend 2000 units vitamin D 3 daily  History of glucose  intolerance. Last year hemoglobin A1c was 5.7%  History of bilateral breast reduction  Plan: Return in one year or as needed. Take vitamin D supplements. Diet and exercise recommended.

## 2014-11-25 NOTE — Patient Instructions (Signed)
Was pleasure to see today. Take 2000 units of vitamin D3 daily for vitamin D deficiency. Repeat bone density study in 1-2 years. Continue same medications. Watch alcohol consumption. Diet and exercise recommended.

## 2014-12-07 ENCOUNTER — Other Ambulatory Visit: Payer: Self-pay

## 2014-12-07 DIAGNOSIS — Z1231 Encounter for screening mammogram for malignant neoplasm of breast: Secondary | ICD-10-CM

## 2014-12-17 ENCOUNTER — Encounter: Payer: Self-pay | Admitting: Internal Medicine

## 2014-12-17 ENCOUNTER — Ambulatory Visit (INDEPENDENT_AMBULATORY_CARE_PROVIDER_SITE_OTHER): Payer: BLUE CROSS/BLUE SHIELD | Admitting: Internal Medicine

## 2014-12-17 VITALS — BP 158/90 | HR 68 | Temp 97.2°F | Ht 66.0 in | Wt 186.0 lb

## 2014-12-17 DIAGNOSIS — Z23 Encounter for immunization: Secondary | ICD-10-CM

## 2014-12-17 DIAGNOSIS — M7742 Metatarsalgia, left foot: Secondary | ICD-10-CM | POA: Diagnosis not present

## 2014-12-19 ENCOUNTER — Ambulatory Visit
Admission: RE | Admit: 2014-12-19 | Discharge: 2014-12-19 | Disposition: A | Discharge status: Home / Self Care | Payer: BLUE CROSS/BLUE SHIELD | Source: Ambulatory Visit | Attending: Internal Medicine | Admitting: Internal Medicine

## 2014-12-19 ENCOUNTER — Telehealth: Payer: Self-pay

## 2014-12-19 DIAGNOSIS — Z23 Encounter for immunization: Secondary | ICD-10-CM | POA: Diagnosis not present

## 2014-12-19 NOTE — Progress Notes (Signed)
   Subjective:    Patient ID: Beverly [name], female    DOB: 06/23/1950, 64 y.o.   MRN: [id number]  HPI Patient denies any injury to left foot but is having issues with left foot pain particularly over second metatarsal. Says it does not feel like a stress fracture which she has had in the past. Has noticed some swelling and some slight redness dorsum of foot    Review of Systems     Objective:   Physical Exam  She is tender over her left second metatarsal. There is puffiness and some redness distal metatarsal areas dorsum of foot      Assessment & Plan:  Possible stress fracture  Plan: X-ray of left foot for further evaluation  Addendum: X-ray of left foot is negative. Recommend Ace wrap to left foot for 10 days. Place ice on it for 20 minutes twice daily and keep foot elevated. If symptoms do not improve, referred orthopedist.

## 2014-12-19 NOTE — Telephone Encounter (Signed)
Left message informing patient of results and recommendations. 

## 2014-12-19 NOTE — Patient Instructions (Addendum)
Apply ice to left foot 20 minutes twice daily. Keep foot wrapped while ambulating on left foot. Keep foot elevated as much as possible. If not better in 10 days refer to orthopedist.

## 2014-12-19 NOTE — Telephone Encounter (Signed)
-----   Message from Margaree Mackintosh, MD sent at 12/19/2014 10:45 AM EDT ----- No evidence of fracture. Would use ace wrap for 2 weeks, keep elevated and apply ice. If no better, then needs to see orthopedist

## 2014-12-21 ENCOUNTER — Ambulatory Visit
Admission: RE | Admit: 2014-12-21 | Discharge: 2014-12-21 | Disposition: A | Discharge status: Home / Self Care | Payer: BLUE CROSS/BLUE SHIELD | Source: Ambulatory Visit

## 2014-12-21 DIAGNOSIS — Z1231 Encounter for screening mammogram for malignant neoplasm of breast: Secondary | ICD-10-CM

## 2015-03-11 ENCOUNTER — Ambulatory Visit (INDEPENDENT_AMBULATORY_CARE_PROVIDER_SITE_OTHER): Payer: BLUE CROSS/BLUE SHIELD | Admitting: Internal Medicine

## 2015-03-11 ENCOUNTER — Encounter: Payer: Self-pay | Admitting: Internal Medicine

## 2015-03-11 VITALS — BP 130/84 | HR 78 | Temp 97.1°F | Resp 20 | Wt 185.0 lb

## 2015-03-11 DIAGNOSIS — J069 Acute upper respiratory infection, unspecified: Secondary | ICD-10-CM

## 2015-03-11 DIAGNOSIS — H6091 Unspecified otitis externa, right ear: Secondary | ICD-10-CM | POA: Diagnosis not present

## 2015-03-11 DIAGNOSIS — H66001 Acute suppurative otitis media without spontaneous rupture of ear drum, right ear: Secondary | ICD-10-CM

## 2015-03-11 MED ORDER — AMOXICILLIN 500 MG PO CAPS: 500.0000 mg | ORAL_CAPSULE | Freq: Three times a day (TID) | ORAL | Status: DC

## 2015-03-11 MED ORDER — NEOMYCIN-POLYMYXIN-HC 3.5-10000-1 OT SOLN: OTIC | Status: DC

## 2015-03-11 NOTE — Patient Instructions (Addendum)
Cortisporin Otic 4 drops 4 times daily x 5 days Amoxicillin 500 mg 3 times daily x 10 days. Call if not better in 48 hours or sooner if worse

## 2015-03-11 NOTE — Progress Notes (Signed)
   Subjective:    Patient ID: Beverly [name], female    DOB: 1950-11-22, 65 y.o.   MRN: [id number]  HPI Rather abrupt onset of right ear pain. Has had slight cough. No fever or shaking chills. No sore throat.    Review of Systems symptoms as above.     Objective:   Physical Exam  Right TM is red. Right external canal is also red. No drainage noted in ear canal. No rupture of TM. Left TM slightly full. Pharynx very slightly injected without exudate. Neck is supple. Chest clear to auscultation. Skin warm and dry. Cardiac exam regular rate and rhythm. Extremities without edema.      Assessment & Plan:  Acute right otitis media  Acute right otitis externa  Acute URI  Plan: Amoxicillin 500 mg 3 times daily for 10 days. Cortisporin otic suspension 4 drops in right ear canal 4 times a day for 5 days.

## 2015-03-15 ENCOUNTER — Telehealth: Payer: Self-pay | Admitting: Internal Medicine

## 2015-03-15 NOTE — Telephone Encounter (Signed)
Take Dayquil  for ear congestion and finish antibiotic.

## 2015-03-15 NOTE — Telephone Encounter (Signed)
Patient said she has been on Amoxicillin since Monday but the right ear is still clogged up. She wants to know if you want to put her on something else or wait until she finishes the medication.

## 2015-03-15 NOTE — Telephone Encounter (Signed)
Patient notified

## 2016-05-01 ENCOUNTER — Other Ambulatory Visit: Payer: Self-pay | Admitting: Internal Medicine

## 2016-05-01 DIAGNOSIS — Z1231 Encounter for screening mammogram for malignant neoplasm of breast: Secondary | ICD-10-CM

## 2016-05-14 ENCOUNTER — Ambulatory Visit
Admission: RE | Admit: 2016-05-14 | Discharge: 2016-05-14 | Disposition: A | Discharge status: Home / Self Care | Payer: Medicare Other | Source: Ambulatory Visit | Attending: Internal Medicine | Admitting: Internal Medicine

## 2016-05-14 DIAGNOSIS — Z1231 Encounter for screening mammogram for malignant neoplasm of breast: Secondary | ICD-10-CM

## 2018-11-24 ENCOUNTER — Other Ambulatory Visit: Payer: BLUE CROSS/BLUE SHIELD | Admitting: Internal Medicine

## 2018-11-28 ENCOUNTER — Encounter: Payer: BLUE CROSS/BLUE SHIELD | Admitting: Internal Medicine

## 2019-02-16 ENCOUNTER — Other Ambulatory Visit: Payer: BC Managed Care – PPO | Admitting: Internal Medicine

## 2019-02-17 ENCOUNTER — Other Ambulatory Visit: Payer: BC Managed Care – PPO | Admitting: Internal Medicine

## 2019-02-17 ENCOUNTER — Other Ambulatory Visit: Payer: Self-pay

## 2019-02-18 LAB — CBC WITH DIFFERENTIAL/PLATELET
Absolute Monocytes: 623 cells/uL (ref 200–950)
Basophils Absolute: 42 cells/uL (ref 0–200)
Basophils Relative: 0.6 %
Eosinophils Absolute: 308 cells/uL (ref 15–500)
Eosinophils Relative: 4.4 %
HCT: 40.6 % (ref 35.0–45.0)
Hemoglobin: 13.6 g/dL (ref 11.7–15.5)
Lymphs Abs: 1295 cells/uL (ref 850–3900)
MCH: 32.2 pg (ref 27.0–33.0)
MCHC: 33.5 g/dL (ref 32.0–36.0)
MCV: 96.2 fL (ref 80.0–100.0)
MPV: 11.7 fL (ref 7.5–12.5)
Monocytes Relative: 8.9 %
Neutro Abs: 4732 cells/uL (ref 1500–7800)
Neutrophils Relative %: 67.6 %
Platelets: 276 10*3/uL (ref 140–400)
RBC: 4.22 10*6/uL (ref 3.80–5.10)
RDW: 12 % (ref 11.0–15.0)
Total Lymphocyte: 18.5 %
WBC: 7 10*3/uL (ref 3.8–10.8)

## 2019-02-18 LAB — COMPLETE METABOLIC PANEL WITH GFR
AG Ratio: 1.9 (calc) (ref 1.0–2.5)
ALT: 31 U/L — ABNORMAL HIGH (ref 6–29)
AST: 32 U/L (ref 10–35)
Albumin: 4.4 g/dL (ref 3.6–5.1)
Alkaline phosphatase (APISO): 91 U/L (ref 37–153)
BUN: 18 mg/dL (ref 7–25)
CO2: 25 mmol/L (ref 20–32)
Calcium: 9.5 mg/dL (ref 8.6–10.4)
Chloride: 107 mmol/L (ref 98–110)
Creat: 0.82 mg/dL (ref 0.50–0.99)
GFR, Est African American: 85 mL/min/{1.73_m2} (ref >=60)
GFR, Est Non African American: 73 mL/min/{1.73_m2} (ref >=60)
Globulin: 2.3 g/dL (calc) (ref 1.9–3.7)
Glucose, Bld: 103 mg/dL — ABNORMAL HIGH (ref 65–99)
Potassium: 4.3 mmol/L (ref 3.5–5.3)
Sodium: 143 mmol/L (ref 135–146)
Total Bilirubin: 0.4 mg/dL (ref 0.2–1.2)
Total Protein: 6.7 g/dL (ref 6.1–8.1)

## 2019-02-18 LAB — HEMOGLOBIN A1C
Hgb A1c MFr Bld: 5.7 % of total Hgb — ABNORMAL HIGH (ref <5.7)
Mean Plasma Glucose: 117 (calc)
eAG (mmol/L): 6.5 (calc)

## 2019-02-18 LAB — LIPID PANEL
Cholesterol: 177 mg/dL (ref <200)
HDL: 94 mg/dL (ref >=50)
LDL Cholesterol (Calc): 68 mg/dL (calc)
Non-HDL Cholesterol (Calc): 83 mg/dL (calc) (ref <130)
Total CHOL/HDL Ratio: 1.9 (calc) (ref <5.0)
Triglycerides: 70 mg/dL (ref <150)

## 2019-02-18 LAB — TSH: TSH: 3.52 mIU/L (ref 0.40–4.50)

## 2019-02-21 ENCOUNTER — Other Ambulatory Visit: Payer: Self-pay

## 2019-02-21 ENCOUNTER — Ambulatory Visit: Payer: BC Managed Care – PPO | Admitting: Internal Medicine

## 2019-02-21 ENCOUNTER — Encounter: Payer: Self-pay | Admitting: Internal Medicine

## 2019-02-21 VITALS — BP 160/80 | HR 70 | Temp 97.7°F | Ht 64.0 in | Wt 191.0 lb

## 2019-02-21 DIAGNOSIS — Z8719 Personal history of other diseases of the digestive system: Secondary | ICD-10-CM

## 2019-02-21 DIAGNOSIS — M8580 Other specified disorders of bone density and structure, unspecified site: Secondary | ICD-10-CM

## 2019-02-21 DIAGNOSIS — Z87898 Personal history of other specified conditions: Secondary | ICD-10-CM | POA: Diagnosis not present

## 2019-02-21 DIAGNOSIS — R030 Elevated blood-pressure reading, without diagnosis of hypertension: Secondary | ICD-10-CM

## 2019-02-21 DIAGNOSIS — Z8781 Personal history of (healed) traumatic fracture: Secondary | ICD-10-CM

## 2019-02-21 DIAGNOSIS — Z0000 Encounter for general adult medical examination without abnormal findings: Secondary | ICD-10-CM

## 2019-02-21 DIAGNOSIS — Z23 Encounter for immunization: Secondary | ICD-10-CM | POA: Diagnosis not present

## 2019-02-21 DIAGNOSIS — Z1231 Encounter for screening mammogram for malignant neoplasm of breast: Secondary | ICD-10-CM | POA: Diagnosis not present

## 2019-02-21 DIAGNOSIS — Z9049 Acquired absence of other specified parts of digestive tract: Secondary | ICD-10-CM

## 2019-02-21 DIAGNOSIS — L900 Lichen sclerosus et atrophicus: Secondary | ICD-10-CM

## 2019-02-21 LAB — POCT URINALYSIS DIPSTICK
Appearance: NEGATIVE
Bilirubin, UA: NEGATIVE
Blood, UA: NEGATIVE
Glucose, UA: NEGATIVE
Ketones, UA: NEGATIVE
Leukocytes, UA: NEGATIVE
Nitrite, UA: NEGATIVE
Odor: NEGATIVE
Protein, UA: NEGATIVE
Spec Grav, UA: 1.01 (ref 1.010–1.025)
Urobilinogen, UA: 0.2 E.U./dL
pH, UA: 6.5 (ref 5.0–8.0)

## 2019-02-21 NOTE — Progress Notes (Addendum)
Subjective:    Patient ID: Beverly [name], female    DOB: September 28, 1950, 68 y.o.   MRN: [id number]  HPI  68 year old for health maintenance exam and evaluation of medical issues.  Has not been seen since 2017.  Had flu vaccine in November.  Past due for tetanus immunization This will be deferred.  Was given pneumococcal 23 vaccine today.  Should defer Shingrix vaccine until after the pandemic.  She has a history of Crohn's disease, vitamin D deficiency, osteopenia, impaired glucose tolerance.  History of lichen sclerosis.  History of bilateral breast reduction.  She is intolerant of Flagyl.  Says it causes numbness in her feet.  Has GYN physician.  She used to take Fosamax but quit in 2008.  In 2016 bone density study showed T score in the right hip of -2 and LS spine of -1.41.  Bone density study has been scheduled for March 2021.  Initially was treated with Imuran for Crohn's disease  but has been off this medication since around 2012.  She is followed by Dr. Watt Climes for inflammatory bowel disease.  Crohn's disease was diagnosed in 1984.  She had sigmoidoscopy with Dr. Watt Climes July 2010.  She had colectomy in March 2004 for inflammatory bowel disease.  Stress fracture right foot October 2008.  Surgery for strabismus in childhood and also on both eyes in 1999.  Bilateral tubal ligation 1994.  Family history: Father died at age 53 with history of prostate cancer.  Sister with history of fibromyalgia, quadruple bypass surgery at age 23 and prediabetes.  Mother deceased at age 65 likely from congestive heart failure with history of endometrial and colon cancer as well as hypothyroidism and hypertension.  2 adult sons.  Social history: She has a Scientist, water quality and a JD degree.  She teaches at [employer].  She is divorced.  Does not smoke.  Quit smoking in 2002.  Social alcohol consumption consisting of wine.  In April 2016 she suffered a bimalleolar right ankle fracture that required  reduction in the emergency department due to moderate displacement.  Was subsequently seen by [facility].    Review of Systems  Respiratory: Negative.   Cardiovascular: Negative.   Genitourinary: Negative.   Neurological: Negative.   Psychiatric/Behavioral: Negative.        Objective:   Physical Exam Blood pressure 160/80 pulse 70 temperature 97.7 pulse oximetry 98% weight 191 pounds BMI 32.79 Skin warm and dry.  Nodes none.  TMs clear.  Pharynx not examined.  Neck is supple without JVD thyromegaly or carotid bruits.  She is status post bilateral breast reduction surgery.  Cardiac exam regular rate and rhythm normal S1 and S2.  Abdomen no hepatosplenomegaly masses or tenderness.  GYN exam is deferred to GYN physician.  No lower extremity edema.  Neuro no gross focal deficits on brief neurological exam.  She has normal mood and affect and her behavior is normal.      Assessment & Plan:  History of Crohn's disease status post colectomy in 2004  History of right ankle fracture in 4268  History of lichen sclerosis  History of bilateral breast reduction  Elevated blood pressure-needs to obtain home blood pressure monitor and call me if blood pressure is persistently elevated   She has a history of impaired glucose tolerance and hemoglobin A1c is 5.7%.  Fasting glucose is 103.  Continue to watch diet.  Try to exercise.  Plan: Return in 1 year or as needed.  Recommend  repeat bone density study which was scheduled.  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  Monitor blood pressure at home and let me know if persistently elevated.

## 2019-03-25 ENCOUNTER — Encounter: Payer: Self-pay | Admitting: Internal Medicine

## 2019-03-25 NOTE — Patient Instructions (Signed)
Monitor blood pressure at home.  Have bone density study.  It was a pleasure to see you today.  Return in 1 year or as needed.

## 2019-04-30 ENCOUNTER — Ambulatory Visit: Payer: BC Managed Care – PPO | Attending: Internal Medicine

## 2019-04-30 DIAGNOSIS — Z23 Encounter for immunization: Secondary | ICD-10-CM | POA: Insufficient documentation

## 2019-04-30 NOTE — Progress Notes (Signed)
   Covid-19 Vaccination Clinic  Name:  Beverly [name]    MRN: [id number] DOB: October 06, 1950  04/30/2019  [name] was observed post Covid-19 immunization for 15 minutes without incidence. She was provided with Vaccine Information Sheet and instruction to access the V-Safe system.   [name] was instructed to call 911 with any severe reactions post vaccine: Marland Kitchen Difficulty breathing  . Swelling of your face and throat  . A fast heartbeat  . A bad rash all over your body  . Dizziness and weakness    Immunizations Administered    Name Date Dose VIS Date Route   Pfizer COVID-19 Vaccine 04/30/2019 12:02 PM 0.3 mL 02/10/2019 Intramuscular   Manufacturer: [employer], [employer]   Lot: [id number]   NDC: [address]-9

## 2019-05-17 ENCOUNTER — Other Ambulatory Visit: Payer: Self-pay

## 2019-05-17 ENCOUNTER — Ambulatory Visit
Admission: RE | Admit: 2019-05-17 | Discharge: 2019-05-17 | Disposition: A | Discharge status: Home / Self Care | Payer: BC Managed Care – PPO | Source: Ambulatory Visit | Attending: Internal Medicine | Admitting: Internal Medicine

## 2019-05-17 DIAGNOSIS — M8580 Other specified disorders of bone density and structure, unspecified site: Secondary | ICD-10-CM

## 2019-05-17 DIAGNOSIS — Z1231 Encounter for screening mammogram for malignant neoplasm of breast: Secondary | ICD-10-CM

## 2019-05-31 ENCOUNTER — Ambulatory Visit: Payer: BC Managed Care – PPO | Attending: Internal Medicine

## 2019-05-31 DIAGNOSIS — Z23 Encounter for immunization: Secondary | ICD-10-CM

## 2019-05-31 NOTE — Progress Notes (Signed)
   Covid-19 Vaccination Clinic  Name:  Kymia Simi    MRN: [id number] DOB: [birth year]  05/31/2019  Ms. Lansdale was observed post Covid-19 immunization for 15 minutes without incident. She was provided with Vaccine Information Sheet and instruction to access the V-Safe system.   Ms. Krabbenhoft was instructed to call 911 with any severe reactions post vaccine: Marland Kitchen Difficulty breathing  . Swelling of face and throat  . A fast heartbeat  . A bad rash all over body  . Dizziness and weakness   Immunizations Administered    Name Date Dose VIS Date Route   Pfizer COVID-19 Vaccine 05/31/2019 11:12 AM 0.3 mL 02/10/2019 Intramuscular   Manufacturer: [employer], [employer]   Lot: [id number]   NDC: [address]-4

## 2020-01-13 ENCOUNTER — Other Ambulatory Visit: Payer: Self-pay

## 2020-01-13 ENCOUNTER — Ambulatory Visit: Payer: BC Managed Care – PPO | Attending: Internal Medicine

## 2020-01-13 DIAGNOSIS — Z23 Encounter for immunization: Secondary | ICD-10-CM

## 2020-01-13 NOTE — Progress Notes (Signed)
   Covid-19 Vaccination Clinic  Name:  Beverly [name]    MRN: [id number] DOB: 1950/05/01  01/13/2020  [name] was observed post Covid-19 immunization for 15 minutes without incident. She was provided with Vaccine Information Sheet and instruction to access the V-Safe system.   [name] was instructed to call 911 with any severe reactions post vaccine: Marland Kitchen Difficulty breathing  . Swelling of face and throat  . A fast heartbeat  . A bad rash all over body  . Dizziness and weakness   Immunizations Administered    Name Date Dose VIS Date Route   Pfizer COVID-19 Vaccine 01/13/2020 11:42 AM 0.3 mL 12/20/2019 Intramuscular   Manufacturer: [employer], [employer]   Lot: [id number]   NDC: [address]-9

## 2020-03-22 ENCOUNTER — Other Ambulatory Visit: Payer: BC Managed Care – PPO | Admitting: Internal Medicine

## 2020-03-22 ENCOUNTER — Other Ambulatory Visit: Payer: Self-pay

## 2020-03-22 DIAGNOSIS — Z1329 Encounter for screening for other suspected endocrine disorder: Secondary | ICD-10-CM

## 2020-03-22 DIAGNOSIS — Z13220 Encounter for screening for lipoid disorders: Secondary | ICD-10-CM

## 2020-03-22 DIAGNOSIS — Z8719 Personal history of other diseases of the digestive system: Secondary | ICD-10-CM

## 2020-03-22 DIAGNOSIS — R7302 Impaired glucose tolerance (oral): Secondary | ICD-10-CM

## 2020-03-22 DIAGNOSIS — M8580 Other specified disorders of bone density and structure, unspecified site: Secondary | ICD-10-CM

## 2020-03-22 DIAGNOSIS — Z0000 Encounter for general adult medical examination without abnormal findings: Secondary | ICD-10-CM

## 2020-03-23 LAB — COMPLETE METABOLIC PANEL WITH GFR
AG Ratio: 1.9 (calc) (ref 1.0–2.5)
ALT: 21 U/L (ref 6–29)
AST: 28 U/L (ref 10–35)
Albumin: 4.5 g/dL (ref 3.6–5.1)
Alkaline phosphatase (APISO): 84 U/L (ref 37–153)
BUN: 19 mg/dL (ref 7–25)
CO2: 27 mmol/L (ref 20–32)
Calcium: 9.8 mg/dL (ref 8.6–10.4)
Chloride: 107 mmol/L (ref 98–110)
Creat: 0.77 mg/dL (ref 0.60–0.93)
GFR, Est African American: 91 mL/min/{1.73_m2} (ref >=60)
GFR, Est Non African American: 78 mL/min/{1.73_m2} (ref >=60)
Globulin: 2.4 g/dL (calc) (ref 1.9–3.7)
Glucose, Bld: 84 mg/dL (ref 65–99)
Potassium: 5.2 mmol/L (ref 3.5–5.3)
Sodium: 142 mmol/L (ref 135–146)
Total Bilirubin: 0.6 mg/dL (ref 0.2–1.2)
Total Protein: 6.9 g/dL (ref 6.1–8.1)

## 2020-03-23 LAB — CBC WITH DIFFERENTIAL/PLATELET
Absolute Monocytes: 523 cells/uL (ref 200–950)
Basophils Absolute: 50 cells/uL (ref 0–200)
Basophils Relative: 0.8 %
Eosinophils Absolute: 309 cells/uL (ref 15–500)
Eosinophils Relative: 4.9 %
HCT: 43.2 % (ref 35.0–45.0)
Hemoglobin: 14.5 g/dL (ref 11.7–15.5)
Lymphs Abs: 1134 cells/uL (ref 850–3900)
MCH: 33.1 pg — ABNORMAL HIGH (ref 27.0–33.0)
MCHC: 33.6 g/dL (ref 32.0–36.0)
MCV: 98.6 fL (ref 80.0–100.0)
MPV: 11.8 fL (ref 7.5–12.5)
Monocytes Relative: 8.3 %
Neutro Abs: 4284 cells/uL (ref 1500–7800)
Neutrophils Relative %: 68 %
Platelets: 264 10*3/uL (ref 140–400)
RBC: 4.38 10*6/uL (ref 3.80–5.10)
RDW: 11.9 % (ref 11.0–15.0)
Total Lymphocyte: 18 %
WBC: 6.3 10*3/uL (ref 3.8–10.8)

## 2020-03-23 LAB — LIPID PANEL
Cholesterol: 192 mg/dL (ref <200)
HDL: 108 mg/dL (ref >=50)
LDL Cholesterol (Calc): 71 mg/dL (calc)
Non-HDL Cholesterol (Calc): 84 mg/dL (calc) (ref <130)
Total CHOL/HDL Ratio: 1.8 (calc) (ref <5.0)
Triglycerides: 57 mg/dL (ref <150)

## 2020-03-23 LAB — HEMOGLOBIN A1C
Hgb A1c MFr Bld: 5.6 % of total Hgb (ref <5.7)
Mean Plasma Glucose: 114 mg/dL
eAG (mmol/L): 6.3 mmol/L

## 2020-03-23 LAB — TSH: TSH: 2.53 mIU/L (ref 0.40–4.50)

## 2020-03-25 ENCOUNTER — Other Ambulatory Visit: Payer: Self-pay

## 2020-03-25 ENCOUNTER — Encounter: Payer: Self-pay | Admitting: Internal Medicine

## 2020-03-25 ENCOUNTER — Ambulatory Visit (INDEPENDENT_AMBULATORY_CARE_PROVIDER_SITE_OTHER): Payer: BC Managed Care – PPO | Admitting: Internal Medicine

## 2020-03-25 VITALS — BP 120/80 | HR 62 | Ht 64.0 in | Wt 179.0 lb

## 2020-03-25 DIAGNOSIS — Z9049 Acquired absence of other specified parts of digestive tract: Secondary | ICD-10-CM | POA: Diagnosis not present

## 2020-03-25 DIAGNOSIS — L900 Lichen sclerosus et atrophicus: Secondary | ICD-10-CM

## 2020-03-25 DIAGNOSIS — Z8719 Personal history of other diseases of the digestive system: Secondary | ICD-10-CM | POA: Diagnosis not present

## 2020-03-25 DIAGNOSIS — Z0000 Encounter for general adult medical examination without abnormal findings: Secondary | ICD-10-CM | POA: Diagnosis not present

## 2020-03-25 DIAGNOSIS — Z8781 Personal history of (healed) traumatic fracture: Secondary | ICD-10-CM

## 2020-03-25 DIAGNOSIS — Z9889 Other specified postprocedural states: Secondary | ICD-10-CM

## 2020-03-25 DIAGNOSIS — M8580 Other specified disorders of bone density and structure, unspecified site: Secondary | ICD-10-CM | POA: Diagnosis not present

## 2020-03-25 DIAGNOSIS — Z87898 Personal history of other specified conditions: Secondary | ICD-10-CM

## 2020-03-25 LAB — POCT URINALYSIS DIPSTICK
Appearance: NEGATIVE
Bilirubin, UA: NEGATIVE
Blood, UA: NEGATIVE
Glucose, UA: NEGATIVE
Ketones, UA: NEGATIVE
Leukocytes, UA: NEGATIVE
Nitrite, UA: NEGATIVE
Odor: NEGATIVE
Protein, UA: NEGATIVE
Spec Grav, UA: 1.01 (ref 1.010–1.025)
Urobilinogen, UA: 0.2 E.U./dL
pH, UA: 6.5 (ref 5.0–8.0)

## 2020-03-25 NOTE — Progress Notes (Signed)
Subjective:    Patient ID: Beverly [name], female    DOB: [birth year], 70 y.o.   MRN: [id number]  HPI 70 year old Female seen for health maintenance exam and evaluation of medical issues.  She has a history of Crohn's disease, vitamin D deficiency, osteopenia, impaired glucose tolerance.  History of lichen sclerosus and would like to see GYN physician.  Dr. Charlies Constable moved to [place].  Suggested she see one of Dr. Brion Aliment partners.  History of bilateral breast reduction.  She is intolerant of Flagyl.  Says it causes numbness in her feet.  She used to take Fosamax but quit in 2008.  In 2016 bone density study showed T score in the right hip of -2 within the LS spine of -1.4.  Bone density study done in March 2021 showed a T score in the right femoral neck of -1.7.  This is consistent with osteopenia.  Suggest repeat study in 2023.  Regarding Crohn's disease, she was treated with Imuran but has been off this medication since around 2012.  Is followed by Dr. Watt Climes for inflammatory bowel disease.  Crohn's disease was diagnosed in 42.  She had a sigmoidoscopy with Dr. Watt Climes July 2010.  She had colectomy in March 2004 for inflammatory bowel disease.  Stress fracture right foot in October 2008.  Surgery for strabismus in childhood and also on both eyes in 1999.  Bilateral tubal ligation 1994.  In April 2016 she suffered a bimalleolar right ankle fracture that required reduction in the emergency department due to moderate displacement.  Was subsequently seen by [facility].  Family history: Father died at age 20 of prostate cancer.  Sister with history of fibromyalgia, prediabetes, and quadruple bypass at age 67.  Mother died at age 33 recently presumably of congestive heart failure with history of endometrial and colon cancer as well as hypothyroidism and hypertension.  2 adult sons.  Social history: She has both a Scientist, water quality and a JD degree.  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  She is divorced.  Does not smoke.  Quit smoking in 2002.  Social alcohol consumption consisting of wine.    Review of Systems  Constitutional: Negative.   Respiratory: Negative.   Cardiovascular: Negative.   Genitourinary:       History of lichen sclerosus  Neurological: Negative.   Psychiatric/Behavioral: Negative.        Objective:   Physical Exam Seen today in no acute distress.  Blood pressure 120/80 pulse 62 regular pulse oximetry 97% weight 179 pounds BMI 30.73.  Previous weight in December 2020 was 191 pounds.  Patient has been walking some during the pandemic.  Watching  diet.  Skin is warm and dry.  No cervical adenopathy.  No thyromegaly.  No carotid bruits.  Chest is clear to auscultation without rales or wheezing.  History of bilateral breast reduction surgery.  Cardiac exam: Regular rate and rhythm.  Abdomen soft nondistended.  Colostomy bag in place.  No hepatosplenomegaly or masses.  No tenderness.  No lower extremity pitting edema.  Neuro: Intact without gross focal deficits.  Affect thought and judgment are normal.       Assessment & Plan:  Osteopenia-continue to monitor bone density every 2 years.  Currently not on bone sparing medication.  History of right ankle fracture in 6979  History of lichen sclerosus-asked patient to see GYN physician regarding this  History of bilateral breast reduction surgery  Impaired glucose tolerance-diet controlled  History of Crohn's disease status post colectomy in 2004 followed by Dr. Watt Climes.  CBC, c-Met, lipid panel, TSH, and hemoglobin A1c all within normal limits.  Previously had hemoglobin A1c of 5.7% a year ago.  This is now normal.  Dipstick urinalysis is also normal.

## 2020-03-25 NOTE — Patient Instructions (Addendum)
It was a pleasure to see you today.  Recommend one of Dr. Liliana Cline partners for follow-up on lichen sclerosus since Dr. Farrel Gobble is moved to [facility].  I am very pleased with your lab work.  Suggest deferring Shingrix series until pandemic has improved.  Has had 3 COVID-19 immunizations.  Return in 1 year or as needed.

## 2020-09-13 ENCOUNTER — Other Ambulatory Visit (HOSPITAL_BASED_OUTPATIENT_CLINIC_OR_DEPARTMENT_OTHER): Payer: Self-pay

## 2020-09-13 ENCOUNTER — Ambulatory Visit: Payer: BC Managed Care – PPO | Attending: Internal Medicine

## 2020-09-13 DIAGNOSIS — Z23 Encounter for immunization: Secondary | ICD-10-CM

## 2020-09-13 MED ORDER — PFIZER-BIONT COVID-19 VAC-TRIS 30 MCG/0.3ML IM SUSP
INTRAMUSCULAR | 0 refills | Status: DC
  Filled 2020-09-13: qty 0.3, 1d supply, fill #0

## 2020-09-13 NOTE — Progress Notes (Signed)
   Covid-19 Vaccination Clinic  Name:  Beverly [name]    MRN: [id number] DOB: [birth year]  09/13/2020  Beverly [name] was observed post Covid-19 immunization for 15 minutes without incident. She was provided with Vaccine Information Sheet and instruction to access the V-Safe system.   Beverly [name] was instructed to call 911 with any severe reactions post vaccine: Difficulty breathing  Swelling of face and throat  A fast heartbeat  A bad rash all over body  Dizziness and weakness   Immunizations Administered     Name Date Dose VIS Date Route   PFIZER Comrnaty(Gray TOP) Covid-19 Vaccine 09/13/2020  2:45 PM 0.3 mL 02/08/2020 Intramuscular   Manufacturer: [employer], [employer]   Lot: [id number]   NDC: 5611233465

## 2021-01-06 ENCOUNTER — Other Ambulatory Visit (HOSPITAL_BASED_OUTPATIENT_CLINIC_OR_DEPARTMENT_OTHER): Payer: Self-pay

## 2021-01-06 ENCOUNTER — Ambulatory Visit: Payer: BC Managed Care – PPO | Attending: Internal Medicine

## 2021-01-06 DIAGNOSIS — Z23 Encounter for immunization: Secondary | ICD-10-CM

## 2021-01-06 MED ORDER — PFIZER COVID-19 VAC BIVALENT 30 MCG/0.3ML IM SUSP
INTRAMUSCULAR | 0 refills | Status: DC
  Filled 2021-01-06: qty 0.3, 1d supply, fill #0

## 2021-01-06 NOTE — Progress Notes (Signed)
   Covid-19 Vaccination Clinic  Name:  Claudett Bayly    MRN: [id number] DOB: 19-Feb-1951  01/06/2021  Ms. Bertini was observed post Covid-19 immunization for 15 minutes without incident. She was provided with Vaccine Information Sheet and instruction to access the V-Safe system.   Ms. Shein was instructed to call 911 with any severe reactions post vaccine: Difficulty breathing  Swelling of face and throat  A fast heartbeat  A bad rash all over body  Dizziness and weakness   Immunizations Administered     Name Date Dose VIS Date Route   Pfizer Covid-19 Vaccine Bivalent Booster 01/06/2021  1:03 PM 0.3 mL 10/30/2020 Intramuscular   Manufacturer: [employer], [employer]   Lot: [id number]   NDC: [id number]

## 2021-04-15 ENCOUNTER — Other Ambulatory Visit: Payer: BC Managed Care – PPO | Admitting: Internal Medicine

## 2021-04-18 ENCOUNTER — Encounter: Payer: BC Managed Care – PPO | Admitting: Internal Medicine

## 2021-08-14 ENCOUNTER — Other Ambulatory Visit: Payer: BC Managed Care – PPO

## 2021-08-14 DIAGNOSIS — E559 Vitamin D deficiency, unspecified: Secondary | ICD-10-CM

## 2021-08-14 DIAGNOSIS — Z136 Encounter for screening for cardiovascular disorders: Secondary | ICD-10-CM

## 2021-08-14 DIAGNOSIS — E7439 Other disorders of intestinal carbohydrate absorption: Secondary | ICD-10-CM

## 2021-08-14 DIAGNOSIS — R5383 Other fatigue: Secondary | ICD-10-CM

## 2021-08-14 DIAGNOSIS — Z8719 Personal history of other diseases of the digestive system: Secondary | ICD-10-CM

## 2021-08-15 LAB — LIPID PANEL
Cholesterol: 172 mg/dL (ref <200)
HDL: 103 mg/dL (ref >=50)
LDL Cholesterol (Calc): 55 mg/dL (calc)
Non-HDL Cholesterol (Calc): 69 mg/dL (calc) (ref <130)
Total CHOL/HDL Ratio: 1.7 (calc) (ref <5.0)
Triglycerides: 64 mg/dL (ref <150)

## 2021-08-15 LAB — CBC WITH DIFFERENTIAL/PLATELET
Absolute Monocytes: 750 cells/uL (ref 200–950)
Basophils Absolute: 40 cells/uL (ref 0–200)
Basophils Relative: 0.6 %
Eosinophils Absolute: 362 cells/uL (ref 15–500)
Eosinophils Relative: 5.4 %
HCT: 42.4 % (ref 35.0–45.0)
Hemoglobin: 14.1 g/dL (ref 11.7–15.5)
Lymphs Abs: 1521 cells/uL (ref 850–3900)
MCH: 32.9 pg (ref 27.0–33.0)
MCHC: 33.3 g/dL (ref 32.0–36.0)
MCV: 98.8 fL (ref 80.0–100.0)
MPV: 11.6 fL (ref 7.5–12.5)
Monocytes Relative: 11.2 %
Neutro Abs: 4027 cells/uL (ref 1500–7800)
Neutrophils Relative %: 60.1 %
Platelets: 269 10*3/uL (ref 140–400)
RBC: 4.29 10*6/uL (ref 3.80–5.10)
RDW: 12.1 % (ref 11.0–15.0)
Total Lymphocyte: 22.7 %
WBC: 6.7 10*3/uL (ref 3.8–10.8)

## 2021-08-15 LAB — COMPLETE METABOLIC PANEL WITH GFR
AG Ratio: 1.8 (calc) (ref 1.0–2.5)
ALT: 23 U/L (ref 6–29)
AST: 26 U/L (ref 10–35)
Albumin: 4.2 g/dL (ref 3.6–5.1)
Alkaline phosphatase (APISO): 81 U/L (ref 37–153)
BUN: 16 mg/dL (ref 7–25)
CO2: 25 mmol/L (ref 20–32)
Calcium: 9.3 mg/dL (ref 8.6–10.4)
Chloride: 107 mmol/L (ref 98–110)
Creat: 0.84 mg/dL (ref 0.60–1.00)
Globulin: 2.3 g/dL (calc) (ref 1.9–3.7)
Glucose, Bld: 92 mg/dL (ref 65–99)
Potassium: 5.2 mmol/L (ref 3.5–5.3)
Sodium: 141 mmol/L (ref 135–146)
Total Bilirubin: 0.5 mg/dL (ref 0.2–1.2)
Total Protein: 6.5 g/dL (ref 6.1–8.1)
eGFR: 74 mL/min/{1.73_m2} (ref >=60)

## 2021-08-15 LAB — HEMOGLOBIN A1C
Hgb A1c MFr Bld: 5.5 % of total Hgb (ref <5.7)
Mean Plasma Glucose: 111 mg/dL
eAG (mmol/L): 6.2 mmol/L

## 2021-08-15 LAB — VITAMIN D 25 HYDROXY (VIT D DEFICIENCY, FRACTURES): Vit D, 25-Hydroxy: 15 ng/mL — ABNORMAL LOW (ref 30–100)

## 2021-08-15 LAB — TSH: TSH: 3.14 mIU/L (ref 0.40–4.50)

## 2021-08-19 ENCOUNTER — Encounter: Payer: Self-pay | Admitting: Internal Medicine

## 2021-08-19 ENCOUNTER — Ambulatory Visit (INDEPENDENT_AMBULATORY_CARE_PROVIDER_SITE_OTHER): Payer: BC Managed Care – PPO | Admitting: Internal Medicine

## 2021-08-19 VITALS — BP 130/72 | HR 72 | Temp 97.9°F | Ht 64.75 in | Wt 183.8 lb

## 2021-08-19 DIAGNOSIS — Z8719 Personal history of other diseases of the digestive system: Secondary | ICD-10-CM | POA: Diagnosis not present

## 2021-08-19 DIAGNOSIS — N951 Menopausal and female climacteric states: Secondary | ICD-10-CM | POA: Diagnosis not present

## 2021-08-19 DIAGNOSIS — Z8781 Personal history of (healed) traumatic fracture: Secondary | ICD-10-CM

## 2021-08-19 DIAGNOSIS — R319 Hematuria, unspecified: Secondary | ICD-10-CM | POA: Diagnosis not present

## 2021-08-19 DIAGNOSIS — M8580 Other specified disorders of bone density and structure, unspecified site: Secondary | ICD-10-CM

## 2021-08-19 DIAGNOSIS — R82998 Other abnormal findings in urine: Secondary | ICD-10-CM | POA: Diagnosis not present

## 2021-08-19 DIAGNOSIS — Z1231 Encounter for screening mammogram for malignant neoplasm of breast: Secondary | ICD-10-CM

## 2021-08-19 DIAGNOSIS — Z0000 Encounter for general adult medical examination without abnormal findings: Secondary | ICD-10-CM | POA: Diagnosis not present

## 2021-08-19 DIAGNOSIS — Z87898 Personal history of other specified conditions: Secondary | ICD-10-CM

## 2021-08-19 DIAGNOSIS — Z9049 Acquired absence of other specified parts of digestive tract: Secondary | ICD-10-CM

## 2021-08-19 DIAGNOSIS — E559 Vitamin D deficiency, unspecified: Secondary | ICD-10-CM

## 2021-08-19 DIAGNOSIS — Z9889 Other specified postprocedural states: Secondary | ICD-10-CM

## 2021-08-19 DIAGNOSIS — L900 Lichen sclerosus et atrophicus: Secondary | ICD-10-CM

## 2021-08-19 LAB — POCT URINALYSIS DIPSTICK
Bilirubin, UA: NEGATIVE
Glucose, UA: NEGATIVE
Ketones, UA: NEGATIVE
Nitrite, UA: NEGATIVE
Protein, UA: NEGATIVE
Spec Grav, UA: 1.015 (ref 1.010–1.025)
Urobilinogen, UA: 0.2 E.U./dL
pH, UA: 5 (ref 5.0–8.0)

## 2021-08-19 MED ORDER — ERGOCALCIFEROL 1.25 MG (50000 UT) PO CAPS: 50000.0000 [IU] | ORAL_CAPSULE | ORAL | 3 refills | Status: DC

## 2021-08-19 NOTE — Progress Notes (Signed)
   Subjective:    Patient ID: Beverly [name], female    DOB: 1950/06/08, 71 y.o.   MRN: [id number]  HPI  71 year old Female seen for health maintenance exam and evaluation of  medical issues.  She has a history of Crohn's disease, vitamin D deficiency, osteopenia, impaired glucose tolerance.  History of lichen sclerosis.  Has seen GYN physician locally.  History of bilateral breast reduction.  Intolerant of Flagyl.  Says it causes numbness in her feet.  She has a history of osteopenia.  Used to take Fosamax but quit in 2008.  Bone density study in March 2021 showed T score in right femoral neck of -1.7 consistent with osteopenia.  Has repeat bone density and mammogram study planned for July.  Regarding Crohn's disease, she was treated with Imuran but stopped this medication around 2012.  Dr. Teena Dunk and his gastroenterologist.  Crohn's disease was diagnosed in 47.  Dr. Feliciana Rossetti did sigmoidoscopy in 2018.  She had colectomy in March 2004 for inflammatory bowel disease.  In April 2016 she suffered a bimalleolar right ankle fracture that required reduction in the emergency department due to moderate displacement and was subsequently seen and treated by [facility].  Stress fracture right foot in 2008.  Surgery for strabismus in childhood and also on both eyes in 1999.  Bilateral tubal ligation 1994.  Family history: Father died at age 1 of prostate cancer.  Sister with history of fibromyalgia, prediabetes and quadruple bypass performed at age 52.  Mother died at age 47 presumably of congestive heart failure with history of endometrial and colon cancer as well as hypothyroidism and hypertension.  She has 2 adult sons.  Social history: She has both a Scientist, water quality and a JD degree.  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  She is divorced.  Does not smoke.  Quit smoking in 2002.  Social alcohol consumption consisting of wine.  Her urine dipstick shows  trace LE but culture is negative.  Her hemoglobin A1c is normal at 5.5%.  CBC with differential c-Met lipid panel and TSH are all normal.  However she has vitamin D deficiency and level is 15.  She will be placed on vitamin D supplementation weekly.  Review of Systems see above     Objective:   Physical Exam Vital signs reviewed.  Blood pressure 130/72 weight 183 pounds 12 ounces.  BMI 30.81.  Temperature 97.9 degrees pulse 72 regular pulse oximetry 98%.  Skin: Warm and dry.  No cervical adenopathy.  No thyromegaly.  No carotid bruits.  Chest is clear.  Cardiac exam: Regular rate and rhythm without ectopy.  Abdomen is soft nondistended without hepatosplenomegaly masses or tenderness.    No pitting edema of the lower extremities.  Affect, thought and judgment are normal.  Neurological exam is intact without gross focal deficits.       Assessment & Plan:   Osteopenia-bone density study ordered for July  Vitamin D deficiency-she will take high-dose vitamin D weekly  History of colectomy for Crohn's disease in 2004 and followed by Dr. Watt Climes  History of bilateral breast reduction surgery  History of lichen sclerosis seen by GYN  History of right ankle fracture 2016  Diet-controlled impaired glucose tolerance  Plan: High-dose vitamin D weekly prescribed for her due to vitamin D deficiency and history of osteopenia.  Return in 1 year or as needed.

## 2021-08-20 LAB — URINE CULTURE
MICRO NUMBER:: 13547693
Result:: NO GROWTH
SPECIMEN QUALITY:: ADEQUATE

## 2021-09-03 ENCOUNTER — Ambulatory Visit
Admission: RE | Admit: 2021-09-03 | Discharge: 2021-09-03 | Disposition: A | Discharge status: Home / Self Care | Payer: BC Managed Care – PPO | Source: Ambulatory Visit | Attending: Internal Medicine | Admitting: Internal Medicine

## 2021-09-03 DIAGNOSIS — N951 Menopausal and female climacteric states: Secondary | ICD-10-CM

## 2021-09-16 ENCOUNTER — Ambulatory Visit
Admission: RE | Admit: 2021-09-16 | Discharge: 2021-09-16 | Disposition: A | Discharge status: Home / Self Care | Payer: BC Managed Care – PPO | Source: Ambulatory Visit | Attending: Internal Medicine | Admitting: Internal Medicine

## 2021-09-16 DIAGNOSIS — Z1231 Encounter for screening mammogram for malignant neoplasm of breast: Secondary | ICD-10-CM

## 2021-12-06 IMAGING — MG DIGITAL SCREENING BILAT W/ CAD
4 series · 4 of 4 positions shown · non-contrast
Comparison: Previous exam(s).

ACR Breast Density Category a: The breast tissue is almost entirely
fatty.

CLINICAL DATA: Screening.

EXAM:
DIGITAL SCREENING BILATERAL MAMMOGRAM WITH CAD

[L MLO]
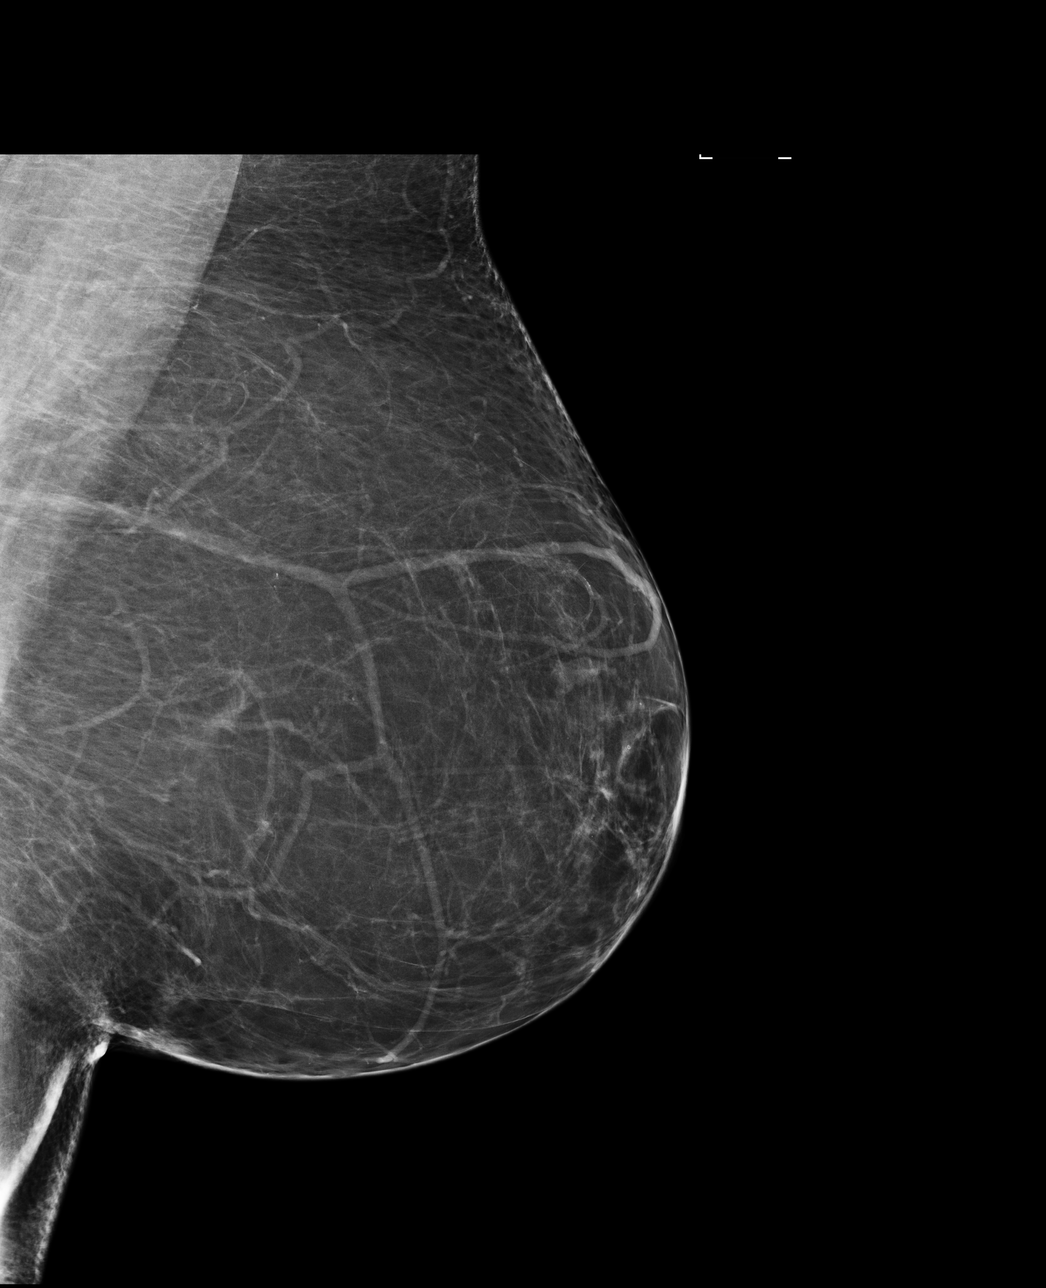

[R MLO]
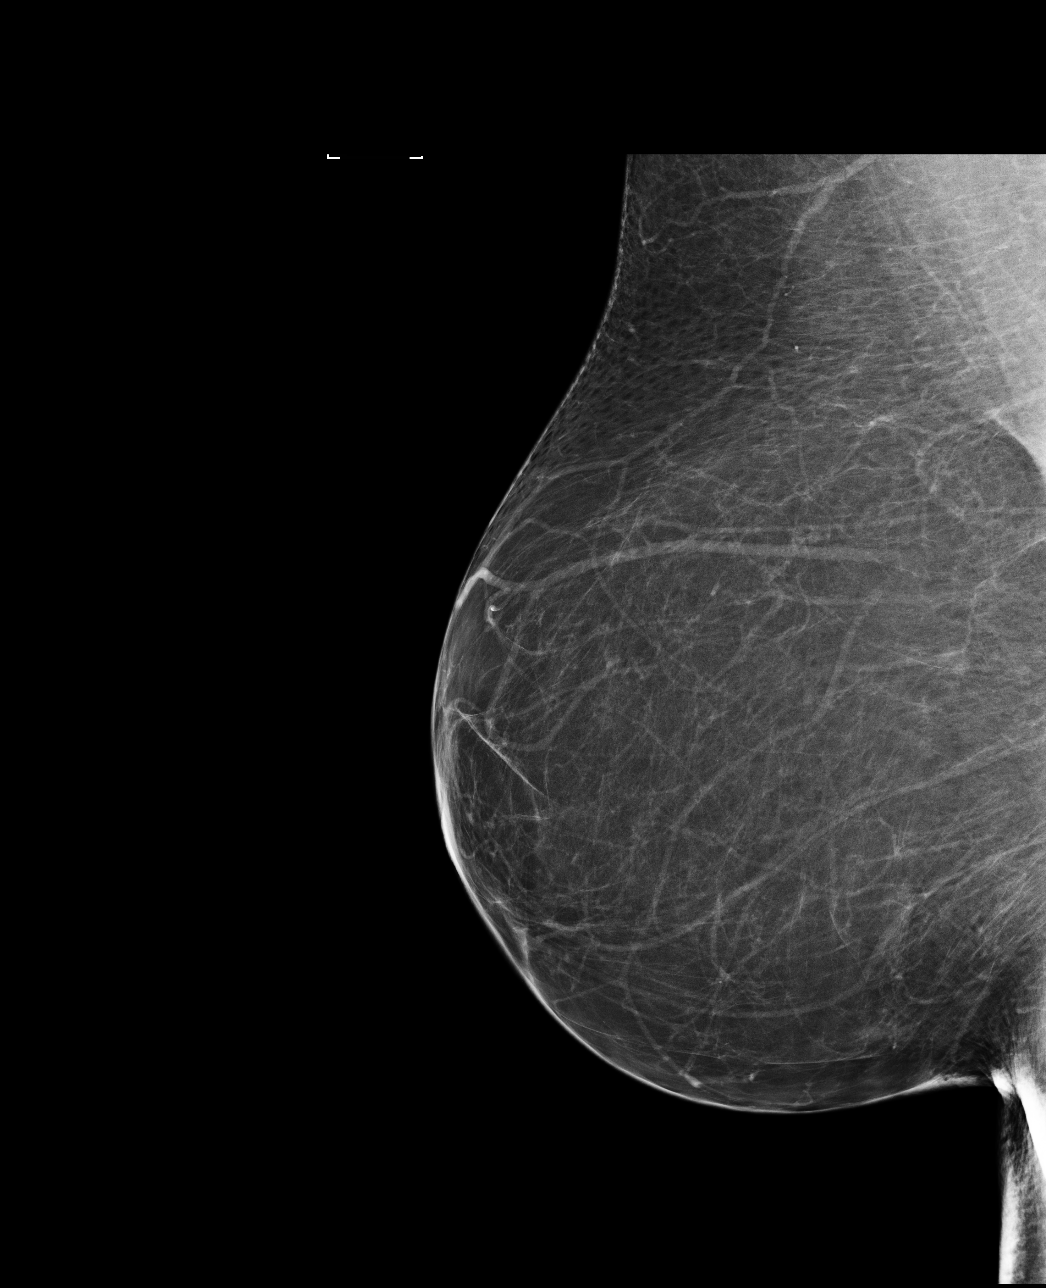

[R CC]
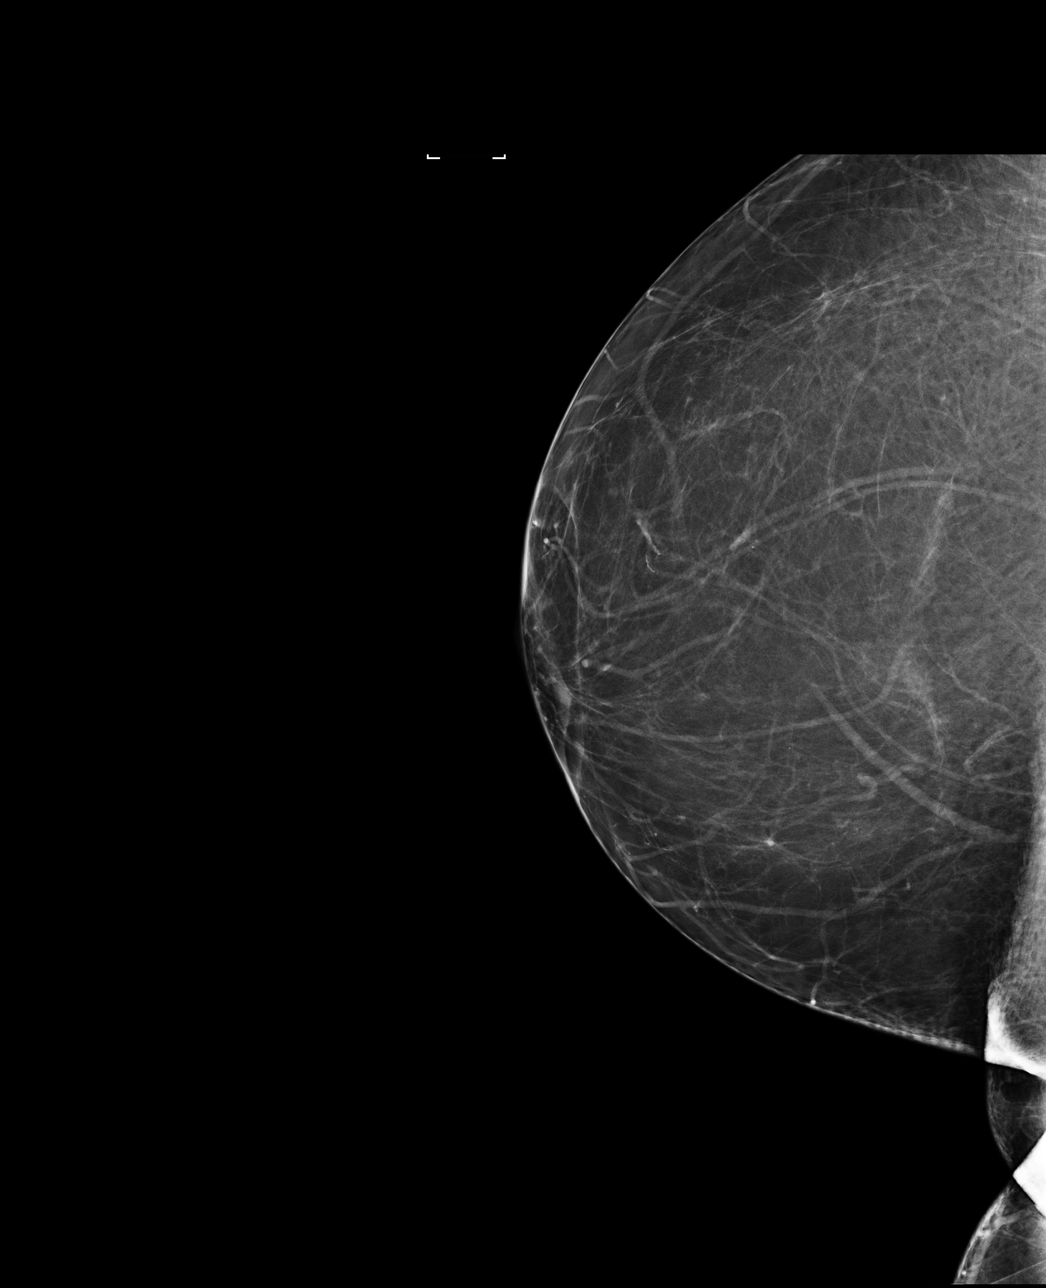

[L CC]
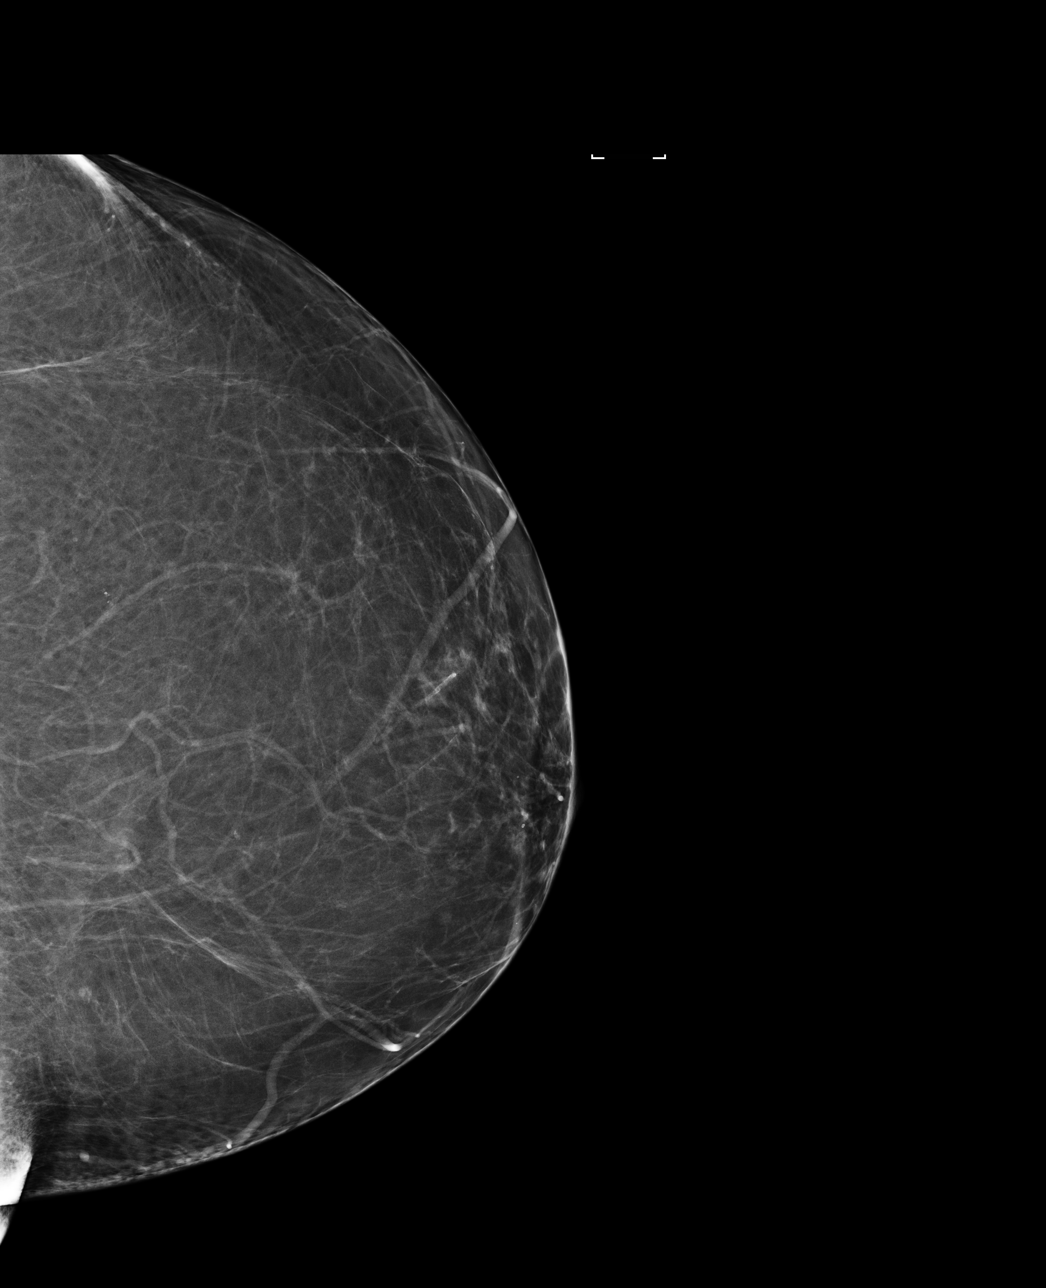

[4 of 4 positions shown; findings below may reference images not displayed]

FINDINGS: There are no findings suspicious for malignancy. Images were
processed with CAD.
IMPRESSION: No mammographic evidence of malignancy. A result letter of this
screening mammogram will be mailed directly to the patient.

RECOMMENDATION:
Screening mammogram in one year. (Code:[id number])

BI-RADS CATEGORY  1: Negative.

## 2021-12-18 ENCOUNTER — Ambulatory Visit (INDEPENDENT_AMBULATORY_CARE_PROVIDER_SITE_OTHER): Payer: Self-pay | Admitting: Nurse Practitioner

## 2021-12-18 DIAGNOSIS — Z23 Encounter for immunization: Secondary | ICD-10-CM

## 2022-02-09 ENCOUNTER — Other Ambulatory Visit (HOSPITAL_BASED_OUTPATIENT_CLINIC_OR_DEPARTMENT_OTHER): Payer: Self-pay

## 2022-02-09 MED ORDER — COVID-19 MRNA VAC-TRIS(PFIZER) 30 MCG/0.3ML IM SUSY
0.3000 mL | PREFILLED_SYRINGE | Freq: Once | INTRAMUSCULAR | 0 refills | Status: AC
  Filled 2022-02-09: qty 0.3, 1d supply, fill #0

## 2022-03-30 ENCOUNTER — Encounter: Payer: Self-pay | Admitting: Internal Medicine

## 2022-03-30 ENCOUNTER — Telehealth: Payer: Self-pay

## 2022-03-30 ENCOUNTER — Ambulatory Visit: Payer: BC Managed Care – PPO | Admitting: Internal Medicine

## 2022-03-30 VITALS — BP 126/72 | HR 60 | Temp 98.3°F | Ht 64.75 in | Wt 185.1 lb

## 2022-03-30 DIAGNOSIS — M79604 Pain in right leg: Secondary | ICD-10-CM

## 2022-03-30 MED ORDER — CYCLOBENZAPRINE HCL 10 MG PO TABS: 10.0000 mg | ORAL_TABLET | Freq: Three times a day (TID) | ORAL | 0 refills | Status: DC | PRN

## 2022-03-30 MED ORDER — MELOXICAM 15 MG PO TABS: 15.0000 mg | ORAL_TABLET | Freq: Every day | ORAL | 0 refills | Status: DC

## 2022-03-30 NOTE — Progress Notes (Addendum)
Patient Care Team: Elby Showers, MD as PCP - General (Internal Medicine)  Visit Date: 04/03/22  Subjective:    Patient ID: Beverly [name] , Female   DOB: [birth year], 72 y.o.    MRN: [id number]   71 y.o. Female presents today for right knee pain. Patient has a past medical history of Crohn's disease, lichen sclerosus and osteopenia.  Reports experiencing right knee pain and strong muscle spasms at night that start in the right knee and radiate upward in her leg. Reports that Alieve does not relieve her pain but Aspirin does. Denies heavy exercise and any clicking or popping in her right knee. She reports hearing crepitus intermittently. Does not like to take NSAIDs.   Dr. Alvester Morin notes from 2021 indicate there was a problem in her left knee. Pain came on gradually without injury. She thought it might be related to an ankle injury in 2016. 2021 X-rays showed slight narrowing of the left knee medial compartment but no fracture. She was treated conservatively.  Crohn's in remission.    Past Medical History:  Diagnosis Date   Family history of adverse reaction to anesthesia    pt's mother has hx. of post-op nausea   History of Crohn's disease    Ileostomy in place [facility])    Lichen sclerosus    Osteopenia      Family History  Problem Relation Age of Onset   Uterine cancer Mother 76   Colon cancer Mother 80   Hypertension Mother    Cancer Mother    Cancer Father     Social History   Social History Narrative   Not on file      Review of Systems  Constitutional:  Negative for fever and malaise/fatigue.  HENT:  Negative for congestion.   Eyes:  Negative for blurred vision.  Respiratory:  Negative for cough and shortness of breath.   Cardiovascular:  Negative for chest pain, palpitations and leg swelling.  Gastrointestinal:  Negative for vomiting.  Musculoskeletal:  Positive for joint pain (Right knee). Negative for back pain.       (+) Muscle spasms in right  leg  Skin:  Negative for rash.  Neurological:  Negative for loss of consciousness and headaches.        Objective:   Vitals: BP 126/72   Pulse 60   Temp 98.3 F (36.8 C) (Tympanic)   Ht 5' 4.75" (1.645 m)   Wt 185 lb 1.9 oz (84 kg)   SpO2 99%   BMI 31.04 kg/m    Physical Exam Vitals and nursing note reviewed.  Constitutional:      General: She is not in acute distress.    Appearance: Normal appearance. She is not toxic-appearing.  HENT:     Head: Normocephalic and atraumatic.  Pulmonary:     Effort: Pulmonary effort is normal.  Abdominal:     General: There is no distension.  Musculoskeletal:     Right knee: Crepitus present. Tenderness present.     Instability Tests: Anterior drawer test negative. Posterior drawer test negative.     Left knee: No crepitus.     Comments: Tenderness at lower end of medial collateral ligament. No inhibition in right quadriceps muscle.  Full flexion and extention of right knee.  2+ right knee reflex 1+ left knee reflex  Straight leg raising test negative at 90 degrees.  Skin:    General: Skin is warm and dry.  Neurological:     Mental Status: She  is alert and oriented to person, place, and time. Mental status is at baseline.  Psychiatric:        Mood and Affect: Mood normal.        Behavior: Behavior normal.        Thought Content: Thought content normal.        Judgment: Judgment normal.       Results:   Studies obtained and personally reviewed by me:    Labs:       Component Value Date/Time   NA 141 08/14/2021 1117   K 5.2 08/14/2021 1117   CL 107 08/14/2021 1117   CO2 25 08/14/2021 1117   GLUCOSE 92 08/14/2021 1117   BUN 16 08/14/2021 1117   CREATININE 0.84 08/14/2021 1117   CALCIUM 9.3 08/14/2021 1117   PROT 6.5 08/14/2021 1117   ALBUMIN 4.0 10/23/2014 1011   AST 26 08/14/2021 1117   ALT 23 08/14/2021 1117   ALKPHOS 110 10/23/2014 1011   BILITOT 0.5 08/14/2021 1117   GFRNONAA 78 03/22/2020 1148   GFRAA  91 03/22/2020 1148     Lab Results  Component Value Date   WBC 6.7 08/14/2021   HGB 14.1 08/14/2021   HCT 42.4 08/14/2021   MCV 98.8 08/14/2021   PLT 269 08/14/2021    Lab Results  Component Value Date   CHOL 172 08/14/2021   HDL 103 08/14/2021   LDLCALC 55 08/14/2021   TRIG 64 08/14/2021   CHOLHDL 1.7 08/14/2021    Lab Results  Component Value Date   HGBA1C 5.5 08/14/2021     Lab Results  Component Value Date   TSH 3.14 08/14/2021      Assessment & Plan:   Right knee pain: Prescribed Flexeril 10 mg three times daily as needed for muscle spasms and Meloxicam 15 mg daily. Ordered VAS [place] lower extremity venous (DVT).  Addendum: venous doppler study is negative  I,Alexander Ruley,acting as a scribe for Elby Showers, MD.,have documented all relevant documentation on the behalf of Elby Showers, MD,as directed by  Elby Showers, MD while in the presence of Elby Showers, MD.    I, Elby Showers, MD, have reviewed all documentation for this visit. The documentation on 04/03/22 for the exam, diagnosis, procedures, and orders are all accurate and complete.

## 2022-03-30 NOTE — Telephone Encounter (Addendum)
Patient called stating arthritis in right knee has flared and has been keeping her awake at night she is also having muscles spasms all started last Thursday evening.  She has been taking advil and asprin. Patient would like to be seen this afternoon I informed her I would send a message to Dr. Renold Genta and we would call her back for scheduling.

## 2022-03-30 NOTE — Telephone Encounter (Signed)
Scheduled

## 2022-03-31 ENCOUNTER — Ambulatory Visit (HOSPITAL_COMMUNITY)
Admission: RE | Admit: 2022-03-31 | Discharge: 2022-03-31 | Disposition: A | Discharge status: Home / Self Care | Payer: BC Managed Care – PPO | Source: Ambulatory Visit | Attending: Internal Medicine | Admitting: Internal Medicine

## 2022-03-31 DIAGNOSIS — M79604 Pain in right leg: Secondary | ICD-10-CM | POA: Insufficient documentation

## 2022-04-03 NOTE — Telephone Encounter (Signed)
After talking with Dr Renold Genta I called and gave patient options of calling [facility] where she had been before or going to walk-in clinic at [facility] or going to emergency room at [facility] or she could be seen here today at 4:00. She wanted to be seen as soon as possible because she was hurting really bad, so she is going to think about her options and decide. Will let us know what she decides.

## 2022-04-03 NOTE — Telephone Encounter (Signed)
Beverly [name] just called to say she is worse today, she has pain in lower back radiating down to knee and then numbness from knee down. Medicine from Monday is not working at all.

## 2022-04-03 NOTE — Patient Instructions (Addendum)
Venous doppler study ordered. Meloxicam 15 mg daily and Flexeril 10 mg up to 3 times daily as needed for spasms.  Add: Venous doppler study negative

## 2022-04-24 ENCOUNTER — Other Ambulatory Visit: Payer: Self-pay | Admitting: Internal Medicine

## 2022-07-27 ENCOUNTER — Other Ambulatory Visit: Payer: Self-pay | Admitting: Internal Medicine

## 2022-08-03 ENCOUNTER — Other Ambulatory Visit: Payer: Self-pay | Admitting: Internal Medicine

## 2022-08-03 DIAGNOSIS — Z1231 Encounter for screening mammogram for malignant neoplasm of breast: Secondary | ICD-10-CM

## 2022-08-31 ENCOUNTER — Other Ambulatory Visit: Payer: BC Managed Care – PPO

## 2022-08-31 DIAGNOSIS — Z0000 Encounter for general adult medical examination without abnormal findings: Secondary | ICD-10-CM

## 2022-08-31 DIAGNOSIS — Z8639 Personal history of other endocrine, nutritional and metabolic disease: Secondary | ICD-10-CM

## 2022-08-31 DIAGNOSIS — Z1329 Encounter for screening for other suspected endocrine disorder: Secondary | ICD-10-CM

## 2022-08-31 DIAGNOSIS — M8580 Other specified disorders of bone density and structure, unspecified site: Secondary | ICD-10-CM

## 2022-08-31 DIAGNOSIS — Z13220 Encounter for screening for lipoid disorders: Secondary | ICD-10-CM

## 2022-08-31 NOTE — Progress Notes (Addendum)
Patient Care Team: Margaree Mackintosh, MD as PCP - General (Internal Medicine)  Visit Date: 09/07/22  Subjective:    Patient ID: [name] , Female   DOB: Nov 06, 1950, 72 y.o.    MRN: [id number]   72 y.o. Female presents today for annual comprehensive physical exam.  History of Vitamin D deficiency treated with Drisdol 50,000 units weekly.  History of osteopenia, impaired glucose tolerance.  History of lichen sclerosis.  Has seen GYN physician locally.  History of bilateral breast reduction.  Intolerant of Flagyl.  Says it causes numbness in her feet.  She has a history of osteopenia.  Used to take Fosamax but quit in 2008. 09/03/21 bone density showed T-score -1.6 at right femur neck.   Regarding Crohn's disease, she was treated with Imuran but stopped this medication around 2012.  Dr. Ewing Schlein is her Gastroenterologist.  Crohn's disease was diagnosed in 40.  Dr. Ewing Schlein did sigmoidoscopy in 2018.  She had colectomy in March 2004 for inflammatory bowel disease.  In April 2016 she suffered a bimalleolar right ankle fracture that required reduction in the emergency department due to moderate displacement and was subsequently seen and treated by [facility]. She was having right knee pain starting in 03/2022 that resolved two months ago and she is now having pain in her upper right leg. She has lingering weakness in right knee. She also has numbness and tingling in her right upper leg with slight tingling in right lower leg. Has some mild pain in right lower back. She was seen at [facility] and had lumbar and right knee imaging done. She plans to see [facility]. Has been taking Celebrex 200 mg daily as needed. Does not notice much improvement with this. Has tried gabapentin, meloxicam in the past without relief.  Stress fracture right foot in 2008.  Surgery for strabismus in childhood and also on both eyes in 1999.  Bilateral tubal ligation 1994.  Glucose normal.  Kidney, liver function normal. Electrolytes normal. Blood proteins normal. MCH elevated at 33.2. Lipid panel normal. TSH at 3.23. Vitamin D at 6.  Mammogram last completed 09/17/21. No mammographic evidence of malignancy. Recommended repeat in 2024.  Family history: Father died at age 58 of prostate cancer.  Sister with history of fibromyalgia, prediabetes and quadruple bypass performed at age 29.  Mother died at age 76 presumably of congestive heart failure with history of endometrial and colon cancer as well as hypothyroidism and hypertension.  She has 2 adult sons.  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  She is divorced.  Does not smoke.  Quit smoking in 2002.  Social alcohol consumption consisting of wine.   Past Medical History:  Diagnosis Date   Family history of adverse reaction to anesthesia    pt's mother has hx. of post-op nausea   History of Crohn's disease    Ileostomy in place [facility])    Lichen sclerosus    Osteopenia      Family History  Problem Relation Age of Onset   Uterine cancer Mother 48   Colon cancer Mother 62   Hypertension Mother    Cancer Mother    Cancer Father     Social History   Social History Narrative   Not on file      Review of Systems  Constitutional:  Negative for chills, fever, malaise/fatigue and weight  loss.  HENT:  Negative for hearing loss, sinus pain and sore throat.   Respiratory:  Negative for cough, hemoptysis and shortness of breath.   Cardiovascular:  Negative for chest pain, palpitations, leg swelling and PND.  Gastrointestinal:  Negative for abdominal pain, constipation, diarrhea, heartburn, nausea and vomiting.  Genitourinary:  Negative for dysuria, frequency and urgency.  Musculoskeletal:  Positive for myalgias (Ritht upper leg). Negative for back pain, joint pain (Right knee) and neck pain.  Skin:  Negative for itching and rash.   Neurological:  Positive for tingling (Right upper leg) and weakness (Right knee). Negative for dizziness, seizures and headaches.  Endo/Heme/Allergies:  Negative for polydipsia.  Psychiatric/Behavioral:  Negative for depression. The patient is not nervous/anxious.         Objective:   Vitals: BP 124/82   Pulse 74   Resp 16   Ht 5\' 5"  (1.651 m)   Wt 184 lb 8 oz (83.7 kg)   SpO2 98%   BMI 30.70 kg/m    Physical Exam Vitals and nursing note reviewed.  Constitutional:      General: She is not in acute distress.    Appearance: Normal appearance. She is not ill-appearing or toxic-appearing.  HENT:     Head: Normocephalic and atraumatic.     Right Ear: Hearing, tympanic membrane, ear canal and external ear normal.     Left Ear: Hearing, tympanic membrane, ear canal and external ear normal.     Mouth/Throat:     Pharynx: Oropharynx is clear.  Eyes:     Extraocular Movements: Extraocular movements intact.     Pupils: Pupils are equal, round, and reactive to light.  Neck:     Thyroid: No thyroid mass, thyromegaly or thyroid tenderness.     Vascular: No carotid bruit.  Cardiovascular:     Rate and Rhythm: Normal rate and regular rhythm. No extrasystoles are present.    Pulses:          Dorsalis pedis pulses are 1+ on the right side and 1+ on the left side.     Heart sounds: Normal heart sounds. No murmur heard.    No friction rub. No gallop.  Pulmonary:     Effort: Pulmonary effort is normal.     Breath sounds: Normal breath sounds. No decreased breath sounds, wheezing, rhonchi or rales.  Chest:     Chest wall: No mass.  Abdominal:     Palpations: Abdomen is soft. There is no hepatomegaly, splenomegaly or mass.     Tenderness: There is no abdominal tenderness.     Hernia: No hernia is present.  Musculoskeletal:     Cervical back: Normal range of motion.     Right lower leg: No edema.     Left lower leg: No edema.     Comments: 5/5 strength in all groups tested.   Lymphadenopathy:     Cervical: No cervical adenopathy.     Upper Body:     Right upper body: No supraclavicular adenopathy.     Left upper body: No supraclavicular adenopathy.  Skin:    General: Skin is warm and dry.  Neurological:     General: No focal deficit present.     Mental Status: She is alert and oriented to person, place, and time. Mental status is at baseline.     Sensory: Sensory deficit: Right upper leg.     Motor: Motor function is intact. No weakness.     Deep Tendon Reflexes:  Reflex Scores:      Patellar reflexes are 1+ on the right side and 2+ on the left side.    Comments: Decreased sensation right anterior upper leg.  Psychiatric:        Attention and Perception: Attention normal.        Mood and Affect: Mood normal.        Speech: Speech normal.        Behavior: Behavior normal.        Thought Content: Thought content normal.        Cognition and Memory: Cognition normal.        Judgment: Judgment normal.      Results:   Studies obtained and personally reviewed by me:  Mammogram last completed 09/17/21. No mammographic evidence of malignancy. Recommended repeat in 2024.  09/03/21 bone density showed T-score -1.6 at right femur neck.   Labs:       Component Value Date/Time   NA 142 08/31/2022 1058   K 3.9 08/31/2022 1058   CL 104 08/31/2022 1058   CO2 31 08/31/2022 1058   GLUCOSE 93 08/31/2022 1058   BUN 19 08/31/2022 1058   CREATININE 0.73 08/31/2022 1058   CALCIUM 9.4 08/31/2022 1058   PROT 6.3 08/31/2022 1058   ALBUMIN 4.0 10/23/2014 1011   AST 16 08/31/2022 1058   ALT 15 08/31/2022 1058   ALKPHOS 110 10/23/2014 1011   BILITOT 0.4 08/31/2022 1058   GFRNONAA 78 03/22/2020 1148   GFRAA 91 03/22/2020 1148     Lab Results  Component Value Date   WBC 9.1 08/31/2022   HGB 14.2 08/31/2022   HCT 42.2 08/31/2022   MCV 98.6 08/31/2022   PLT 277 08/31/2022    Lab Results  Component Value Date   CHOL 185 08/31/2022   HDL 110 08/31/2022    LDLCALC 60 08/31/2022   TRIG 69 08/31/2022   CHOLHDL 1.7 08/31/2022    Lab Results  Component Value Date   HGBA1C 5.5 08/14/2021     Lab Results  Component Value Date   TSH 3.23 08/31/2022      Assessment & Plan:   Vitamin D deficiency: treated with Drisdol 50,000 units weekly.  Neuropathy vs (paresthesias) right upper leg: has not had nerve conduction studies. Ordered SPEP, B12 and folate.  These are WNL except low gamma globulin.  Does not appear to have Myeloma or Vitamin deficiency. She will be seeing  Dr. Maurice Small at [facility] on 09/22/22.  Urinalysis normal.  Osteopenia- takes high dose Vitamin D and does not want to be on bone sparing therapy  Mammogram last completed 09/17/21. No mammographic evidence of malignancy. Repeat scheduled 09/21/22.  09/03/21 bone density showed T-score -1.6 at right femur neck.   Vaccine counseling: due for  Tetanus vaccine. UTD on flu vaccine.  Return in 1 year for health maintenance exam or as needed.    I,Alexander Ruley,acting as a Neurosurgeon for Margaree Mackintosh, MD.,have documented all relevant documentation on the behalf of Margaree Mackintosh, MD,as directed by  Margaree Mackintosh, MD while in the presence of Margaree Mackintosh, MD.   I, Margaree Mackintosh, MD, have reviewed all documentation for this visit. The documentation on 09/20/22 for the exam, diagnosis, procedures, and orders are all accurate and complete.

## 2022-09-01 LAB — LIPID PANEL
Cholesterol: 185 mg/dL (ref <200)
HDL: 110 mg/dL (ref >=50)
LDL Cholesterol (Calc): 60 mg/dL (calc)
Non-HDL Cholesterol (Calc): 75 mg/dL (calc) (ref <130)
Total CHOL/HDL Ratio: 1.7 (calc) (ref <5.0)
Triglycerides: 69 mg/dL (ref <150)

## 2022-09-01 LAB — CBC WITH DIFFERENTIAL/PLATELET
Absolute Monocytes: 737 cells/uL (ref 200–950)
Basophils Absolute: 27 cells/uL (ref 0–200)
Basophils Relative: 0.3 %
Eosinophils Absolute: 137 cells/uL (ref 15–500)
Eosinophils Relative: 1.5 %
HCT: 42.2 % (ref 35.0–45.0)
Hemoglobin: 14.2 g/dL (ref 11.7–15.5)
Lymphs Abs: 1656 cells/uL (ref 850–3900)
MCH: 33.2 pg — ABNORMAL HIGH (ref 27.0–33.0)
MCHC: 33.6 g/dL (ref 32.0–36.0)
MCV: 98.6 fL (ref 80.0–100.0)
MPV: 12 fL (ref 7.5–12.5)
Monocytes Relative: 8.1 %
Neutro Abs: 6543 cells/uL (ref 1500–7800)
Neutrophils Relative %: 71.9 %
Platelets: 277 10*3/uL (ref 140–400)
RBC: 4.28 10*6/uL (ref 3.80–5.10)
RDW: 11.9 % (ref 11.0–15.0)
Total Lymphocyte: 18.2 %
WBC: 9.1 10*3/uL (ref 3.8–10.8)

## 2022-09-01 LAB — COMPLETE METABOLIC PANEL WITH GFR
AG Ratio: 2 (calc) (ref 1.0–2.5)
ALT: 15 U/L (ref 6–29)
AST: 16 U/L (ref 10–35)
Albumin: 4.2 g/dL (ref 3.6–5.1)
Alkaline phosphatase (APISO): 75 U/L (ref 37–153)
BUN: 19 mg/dL (ref 7–25)
CO2: 31 mmol/L (ref 20–32)
Calcium: 9.4 mg/dL (ref 8.6–10.4)
Chloride: 104 mmol/L (ref 98–110)
Creat: 0.73 mg/dL (ref 0.60–1.00)
Globulin: 2.1 g/dL (calc) (ref 1.9–3.7)
Glucose, Bld: 93 mg/dL (ref 65–99)
Potassium: 3.9 mmol/L (ref 3.5–5.3)
Sodium: 142 mmol/L (ref 135–146)
Total Bilirubin: 0.4 mg/dL (ref 0.2–1.2)
Total Protein: 6.3 g/dL (ref 6.1–8.1)
eGFR: 87 mL/min/{1.73_m2} (ref >=60)

## 2022-09-01 LAB — TSH: TSH: 3.23 mIU/L (ref 0.40–4.50)

## 2022-09-01 LAB — VITAMIN D 25 HYDROXY (VIT D DEFICIENCY, FRACTURES): Vit D, 25-Hydroxy: 47 ng/mL (ref 30–100)

## 2022-09-07 ENCOUNTER — Encounter: Payer: Self-pay | Admitting: Internal Medicine

## 2022-09-07 ENCOUNTER — Ambulatory Visit: Payer: BC Managed Care – PPO | Admitting: Internal Medicine

## 2022-09-07 VITALS — BP 124/82 | HR 74 | Resp 16 | Ht 65.0 in | Wt 184.5 lb

## 2022-09-07 DIAGNOSIS — Z8719 Personal history of other diseases of the digestive system: Secondary | ICD-10-CM

## 2022-09-07 DIAGNOSIS — L900 Lichen sclerosus et atrophicus: Secondary | ICD-10-CM | POA: Diagnosis not present

## 2022-09-07 DIAGNOSIS — Z0000 Encounter for general adult medical examination without abnormal findings: Secondary | ICD-10-CM | POA: Diagnosis not present

## 2022-09-07 DIAGNOSIS — Z8781 Personal history of (healed) traumatic fracture: Secondary | ICD-10-CM

## 2022-09-07 DIAGNOSIS — Z87898 Personal history of other specified conditions: Secondary | ICD-10-CM | POA: Diagnosis not present

## 2022-09-07 DIAGNOSIS — G629 Polyneuropathy, unspecified: Secondary | ICD-10-CM

## 2022-09-07 DIAGNOSIS — Z23 Encounter for immunization: Secondary | ICD-10-CM | POA: Diagnosis not present

## 2022-09-07 DIAGNOSIS — Z9889 Other specified postprocedural states: Secondary | ICD-10-CM

## 2022-09-07 DIAGNOSIS — Z9049 Acquired absence of other specified parts of digestive tract: Secondary | ICD-10-CM

## 2022-09-07 DIAGNOSIS — R202 Paresthesia of skin: Secondary | ICD-10-CM

## 2022-09-07 LAB — POCT URINALYSIS DIPSTICK
Bilirubin, UA: NEGATIVE
Blood, UA: NEGATIVE
Glucose, UA: NEGATIVE
Ketones, UA: NEGATIVE
Leukocytes, UA: NEGATIVE
Nitrite, UA: NEGATIVE
Protein, UA: NEGATIVE
Spec Grav, UA: 1.015 (ref 1.010–1.025)
Urobilinogen, UA: 0.2 E.U./dL
pH, UA: 5 (ref 5.0–8.0)

## 2022-09-07 NOTE — Patient Instructions (Addendum)
Suggest tetanus booster. Labs are WNL. Return in one year or as needed. Mammogram appt has been made. Labs are essentially normal. No B12 deficiency and SPEP is WNL.

## 2022-09-15 LAB — PROTEIN ELECTROPHORESIS PANEL 1
Albumin ELP: 4 g/dL (ref 3.8–4.8)
Alpha 1: 0.3 g/dL (ref 0.2–0.3)
Alpha 2: 0.7 g/dL (ref 0.5–0.9)
Beta 2: 0.3 g/dL (ref 0.2–0.5)
Beta Globulin: 0.4 g/dL (ref 0.4–0.6)
Gamma Globulin: 0.6 g/dL — ABNORMAL LOW (ref 0.8–1.7)
IgG (Immunoglobin G), Serum: 677 mg/dL (ref 600–1540)
IgM, Serum: 23 mg/dL — ABNORMAL LOW (ref 50–300)
Immunoglobulin A: 201 mg/dL (ref 70–320)
KAPPA/LAMBDA RATIO: 1.64 (ref 1.29–2.55)
Kappa: 164 mg/dL — ABNORMAL LOW (ref 176–443)
Lambda: 100 mg/dL (ref 91–240)
Total Protein: 6.3 g/dL (ref 6.1–8.1)

## 2022-09-15 LAB — B12 AND FOLATE PANEL
Folate: 14.2 ng/mL
Vitamin B-12: 454 pg/mL (ref 200–1100)

## 2022-09-21 ENCOUNTER — Ambulatory Visit
Admission: RE | Admit: 2022-09-21 | Discharge: 2022-09-21 | Disposition: A | Discharge status: Home / Self Care | Payer: BC Managed Care – PPO | Source: Ambulatory Visit | Attending: Internal Medicine | Admitting: Internal Medicine

## 2022-09-21 DIAGNOSIS — Z1231 Encounter for screening mammogram for malignant neoplasm of breast: Secondary | ICD-10-CM

## 2023-01-05 ENCOUNTER — Other Ambulatory Visit (HOSPITAL_BASED_OUTPATIENT_CLINIC_OR_DEPARTMENT_OTHER): Payer: Self-pay

## 2023-01-05 MED ORDER — INFLUENZA VAC A&B SURF ANT ADJ 0.5 ML IM SUSY
0.5000 mL | PREFILLED_SYRINGE | Freq: Once | INTRAMUSCULAR | 0 refills | Status: AC
  Filled 2023-01-05: qty 0.5, 1d supply, fill #0

## 2023-01-05 MED ORDER — COVID-19 MRNA VAC-TRIS(PFIZER) 30 MCG/0.3ML IM SUSY
0.3000 mL | PREFILLED_SYRINGE | Freq: Once | INTRAMUSCULAR | 0 refills | Status: AC
  Filled 2023-01-05: qty 0.3, 1d supply, fill #0

## 2023-04-06 ENCOUNTER — Encounter: Payer: Self-pay | Admitting: Podiatry

## 2023-04-06 ENCOUNTER — Ambulatory Visit: Payer: BC Managed Care – PPO | Admitting: Podiatry

## 2023-04-06 ENCOUNTER — Ambulatory Visit: Payer: BC Managed Care – PPO

## 2023-04-06 ENCOUNTER — Ambulatory Visit (INDEPENDENT_AMBULATORY_CARE_PROVIDER_SITE_OTHER): Payer: Medicare Other | Admitting: Podiatry

## 2023-04-06 DIAGNOSIS — M778 Other enthesopathies, not elsewhere classified: Secondary | ICD-10-CM

## 2023-04-06 DIAGNOSIS — L84 Corns and callosities: Secondary | ICD-10-CM | POA: Diagnosis not present

## 2023-04-06 DIAGNOSIS — M216X1 Other acquired deformities of right foot: Secondary | ICD-10-CM

## 2023-04-06 DIAGNOSIS — M216X9 Other acquired deformities of unspecified foot: Secondary | ICD-10-CM

## 2023-04-06 NOTE — Progress Notes (Addendum)
 Subjective:   Patient ID: [name], female   DOB: 73 y.o.   MRN: [id number]   HPI  Chief Complaint  Patient presents with   Callouses    RM#13 Right foot callus   73 year old female presents see above concerns.  She said that she has had previous metatarsal fracture several years ago she is also has a history of ankle fracture.  She is going physical therapy for sciatica, nerve damage.  She states that when putting pressure on her foot with physical therapy Symptoms worse.  She was on submetatarsal 1 where she gets the discomfort.  No other treatment.     Review of Systems  All other systems reviewed and are negative.  Past Medical History:  Diagnosis Date   Family history of adverse reaction to anesthesia    pt's mother has hx. of post-op nausea   History of Crohn's disease    Ileostomy in place [name])    Lichen sclerosus    Osteopenia     Past Surgical History:  Procedure Laterality Date   BREAST REDUCTION SURGERY  2011   ORIF ANKLE FRACTURE Right 06/08/2014   Procedure: OPEN REDUCTION INTERNAL FIXATION (ORIF) RIGHT BIMALLEOLAR ANKLE FRACTURE;  Surgeon: Beverly JONETTA Chancy, MD;  Location: [name];  Service: Orthopedics;  Laterality: Right;   REDUCTION MAMMAPLASTY Bilateral    STRABISMUS SURGERY     X 2   TOTAL COLECTOMY  2004   TUBAL LIGATION       Current Outpatient Medications:    Vitamin D , Ergocalciferol , (DRISDOL ) 1.25 MG (50000 UNIT) CAPS capsule, TAKE 1 CAPSULE BY MOUTH 1 TIME A WEEK, Disp: 12 capsule, Rfl: 3   celecoxib (CELEBREX) 200 MG capsule, Take 200 mg by mouth daily., Disp: , Rfl:   Allergies  Allergen Reactions   Flagyl [Metronidazole] Other (See Comments)    PERIPHERAL NEUROPATHY   Adhesive [Tape] Itching and Other (See Comments)    SKIN REDNESS          Objective:  Physical Exam  General: AAO x3, NAD  Dermatological: Hyperkeratotic lesion right foot submetatarsal 1 without any underlying ulceration, drainage or  signs of infection.  There is no open lesions identified otherwise.  Vascular: Dorsalis Pedis artery and Posterior Tibial artery pedal pulses are 2/4 bilateral with immedate capillary fill time.  There is no pain with calf compression, swelling, warmth, erythema.   Neruologic: Grossly intact via light touch bilateral.   Musculoskeletal: Plantar metatarsal head.  Tenderness on the area hypermetabolism.  No other areas of discomfort.  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       Assessment:   Hyperkeratotic lesion, prominent metatarsal head     Plan:  -Treatment options discussed including all alternatives, risks, and complications -Etiology of symptoms were discussed -Sharply debrided the hyperkeratotic lesion with any complications or bleeding.  We discussed different ways to decrease the pressure to the area.  Since metatarsal pads.  Can consider custom orthotics as well given the plantarflexed first metatarsal .  Cavus foot type.  Discussed moisturizer.  Beverly [name] DPM

## 2023-04-15 NOTE — Telephone Encounter (Signed)
I called patient and left voicemail to call back. I am working on trying to figure out how to remove the x-ray.

## 2023-04-16 ENCOUNTER — Telehealth: Payer: Self-pay | Admitting: Podiatry

## 2023-04-16 NOTE — Telephone Encounter (Signed)
Pt called returning your call from yesterday after office hours. Please call her back and she will be around all day.

## 2023-05-10 ENCOUNTER — Encounter: Payer: Self-pay | Admitting: Neurology

## 2023-05-28 ENCOUNTER — Ambulatory Visit: Admitting: Internal Medicine

## 2023-05-28 ENCOUNTER — Encounter: Payer: Self-pay | Admitting: Internal Medicine

## 2023-05-28 VITALS — BP 132/86 | HR 65 | Temp 97.8°F | Resp 12 | Ht 65.0 in | Wt 194.8 lb

## 2023-05-28 DIAGNOSIS — R600 Localized edema: Secondary | ICD-10-CM | POA: Diagnosis not present

## 2023-05-28 LAB — POCT URINALYSIS DIP (MANUAL ENTRY)
Bilirubin, UA: NEGATIVE
Blood, UA: NEGATIVE
Glucose, UA: NEGATIVE mg/dL
Ketones, POC UA: NEGATIVE mg/dL
Leukocytes, UA: NEGATIVE
Nitrite, UA: NEGATIVE
Protein Ur, POC: NEGATIVE mg/dL
Spec Grav, UA: 1.01 (ref 1.010–1.025)
Urobilinogen, UA: 0.2 U/dL
pH, UA: 5 (ref 5.0–8.0)

## 2023-05-28 MED ORDER — FUROSEMIDE 20 MG PO TABS: 20.0000 mg | ORAL_TABLET | Freq: Every day | ORAL | 3 refills | Status: DC

## 2023-05-28 NOTE — Progress Notes (Signed)
 Patient Care Team: Margaree Mackintosh, MD as PCP - General (Internal Medicine)  Visit Date: 05/28/23  Subjective:   Chief Complaint  Patient presents with   Foot Swelling    Right and also right ankle     BP Readings from Last 1 Encounters:  05/28/23 132/86   Patient ZO:[id number] Madelynne, Lasker DOB:02/06/1951,73 y.o. JWJ:[id number]   73 y.o. Female, ambulating with a cane, presents today for acute sick visit with LE Edema on Rest. Patient has a past medical history of ORIF Ankle Fracture, Right 2016 and Fx of 4th & 5th Metatarsals. BP normotensive today at 132/86. She says that since breaking her ankle, there's always been some mild swelling in her right ankle and foot, but within the past 2-3 weeks this has worsened and is also occurring on her left leg. Says that she has been going to PT for sciatica and with the weather warming up has been doing yard work, but otherwise hasn't been strenuously active. She denies any chest pain or shortness of breath, but has been monitoring her blood pressures which have been ranging 118-138 systolically, 65-82 diastolically w/ pulses 59-82. Had a Right Venous Doppler 03/31/2022, which was normal at that time.  Past Medical History:  Diagnosis Date   Family history of adverse reaction to anesthesia    pt's mother has hx. of post-op nausea   History of Crohn's disease    Ileostomy in place (HCC)    Lichen sclerosus    Osteopenia     Allergies  Allergen Reactions   Flagyl [Metronidazole] Other (See Comments)    PERIPHERAL NEUROPATHY   Adhesive [Tape] Itching and Other (See Comments)    SKIN REDNESS    Family History  Problem Relation Age of Onset   Uterine cancer Mother 57   Colon cancer Mother 64   Hypertension Mother    Cancer Mother    Cancer Father    Social History   Social History Narrative   Not on file   Review of Systems  Respiratory:  Negative for shortness of breath.   Cardiovascular:  Positive for leg swelling  (bilateral). Negative for chest pain.     Objective:  Vitals: BP 132/86 (BP Location: Left Arm, Patient Position: Sitting, Cuff Size: Normal)   Pulse 65   Temp 97.8 F (36.6 C) (Temporal)   Resp 12   Ht 5\' 5"  (1.651 m)   Wt 194 lb 12.8 oz (88.4 kg)   SpO2 99%   BMI 32.42 kg/m   Physical Exam Vitals and nursing note reviewed.  Constitutional:      General: She is not in acute distress.    Appearance: Normal appearance. She is not toxic-appearing.  HENT:     Head: Normocephalic and atraumatic.  Cardiovascular:     Pulses:          Dorsalis pedis pulses are 2+ on the right side and 2+ on the left side.     Comments: Light erythema distal extremity extending up towards calf Slightly warm to the touch Pulmonary:     Effort: Pulmonary effort is normal.  Musculoskeletal:     Right lower leg: 2+ Pitting Edema present.     Left lower leg: 2+ Pitting Edema present.  Skin:    General: Skin is warm and dry.  Neurological:     Mental Status: She is alert and oriented to person, place, and time. Mental status is at baseline.  Psychiatric:        Mood and  Affect: Mood normal.        Behavior: Behavior normal.        Thought Content: Thought content normal.        Judgment: Judgment normal.     Results:  Studies Obtained And Personally Reviewed By Me:  03/31/2022 VAS [place] LOWER EXTREMITY VENOUS (DVT)   RIGHT:  - There is no evidence of deep vein thrombosis in the lower extremity.  - No cystic structure found in the popliteal fossa.  LEFT:  - No evidence of common femoral vein obstruction.  Labs:     Component Value Date/Time   NA 142 08/31/2022 1058   K 3.9 08/31/2022 1058   CL 104 08/31/2022 1058   CO2 31 08/31/2022 1058   GLUCOSE 93 08/31/2022 1058   BUN 19 08/31/2022 1058   CREATININE 0.73 08/31/2022 1058   CALCIUM 9.4 08/31/2022 1058   PROT 6.3 09/07/2022 1039   ALBUMIN 4.0 10/23/2014 1011   AST 16 08/31/2022 1058   ALT 15 08/31/2022 1058   ALKPHOS 110 10/23/2014  1011   BILITOT 0.4 08/31/2022 1058   GFRNONAA 78 03/22/2020 1148   GFRAA 91 03/22/2020 1148    Lab Results  Component Value Date   WBC 9.1 08/31/2022   HGB 14.2 08/31/2022   HCT 42.2 08/31/2022   MCV 98.6 08/31/2022   PLT 277 08/31/2022   Lab Results  Component Value Date   CHOL 185 08/31/2022   HDL 110 08/31/2022   LDLCALC 60 08/31/2022   TRIG 69 08/31/2022   CHOLHDL 1.7 08/31/2022   Lab Results  Component Value Date   HGBA1C 5.5 08/14/2021    Lab Results  Component Value Date   TSH 3.23 08/31/2022   Assessment & Plan:   Bilateral Lower Extremity Edema: BP normotensive today at 132/86. Since breaking her ankle, there's been some chronic mild swelling in her right ankle and foot, but this has worsened with the past 2-3 weeks and is also occurring on her left leg. Has been going to PT for sciatica and with the weather warming up has been doing yard work, but otherwise hasn't been strenuously active. Denies any chest pain or shortness of breath, but has been monitoring her blood pressures which have been ranging 118-138 systolically, 65-82 diastolically w/ pulses 59-82. Had a Right Venous Doppler 03/31/2022, which was normal at that time.     I,Emily Lagle,acting as a Neurosurgeon for Margaree Mackintosh, MD.,have documented all relevant documentation on the behalf of Margaree Mackintosh, MD,as directed by  Margaree Mackintosh, MD while in the presence of Margaree Mackintosh, MD.   I, Margaree Mackintosh, MD, have reviewed all documentation for this visit. The documentation on 05/29/23 for the exam, diagnosis, procedures, and orders are all accurate and complete.

## 2023-05-29 ENCOUNTER — Encounter: Payer: Self-pay | Admitting: Internal Medicine

## 2023-05-29 ENCOUNTER — Telehealth: Payer: Self-pay | Admitting: Internal Medicine

## 2023-05-29 DIAGNOSIS — R609 Edema, unspecified: Secondary | ICD-10-CM

## 2023-05-29 LAB — COMPLETE METABOLIC PANEL WITHOUT GFR
AG Ratio: 2 (calc) (ref 1.0–2.5)
ALT: 17 U/L (ref 6–29)
AST: 23 U/L (ref 10–35)
Albumin: 4.3 g/dL (ref 3.6–5.1)
Alkaline phosphatase (APISO): 80 U/L (ref 37–153)
BUN: 16 mg/dL (ref 7–25)
CO2: 25 mmol/L (ref 20–32)
Calcium: 9.5 mg/dL (ref 8.6–10.4)
Chloride: 104 mmol/L (ref 98–110)
Creat: 0.69 mg/dL (ref 0.60–1.00)
Globulin: 2.2 g/dL (ref 1.9–3.7)
Glucose, Bld: 89 mg/dL (ref 65–99)
Potassium: 4.2 mmol/L (ref 3.5–5.3)
Sodium: 140 mmol/L (ref 135–146)
Total Bilirubin: 0.4 mg/dL (ref 0.2–1.2)
Total Protein: 6.5 g/dL (ref 6.1–8.1)

## 2023-05-29 LAB — CBC WITH DIFFERENTIAL/PLATELET
Absolute Lymphocytes: 1394 {cells}/uL (ref 850–3900)
Absolute Monocytes: 639 {cells}/uL (ref 200–950)
Basophils Absolute: 61 {cells}/uL (ref 0–200)
Basophils Relative: 0.9 %
Eosinophils Absolute: 360 {cells}/uL (ref 15–500)
Eosinophils Relative: 5.3 %
HCT: 39.3 % (ref 35.0–45.0)
Hemoglobin: 13.4 g/dL (ref 11.7–15.5)
MCH: 33.6 pg — ABNORMAL HIGH (ref 27.0–33.0)
MCHC: 34.1 g/dL (ref 32.0–36.0)
MCV: 98.5 fL (ref 80.0–100.0)
MPV: 12 fL (ref 7.5–12.5)
Monocytes Relative: 9.4 %
Neutro Abs: 4345 {cells}/uL (ref 1500–7800)
Neutrophils Relative %: 63.9 %
Platelets: 291 10*3/uL (ref 140–400)
RBC: 3.99 10*6/uL (ref 3.80–5.10)
RDW: 11.7 % (ref 11.0–15.0)
Total Lymphocyte: 20.5 %
WBC: 6.8 10*3/uL (ref 3.8–10.8)

## 2023-05-29 LAB — T4, FREE: Free T4: 1.1 ng/dL (ref 0.8–1.8)

## 2023-05-29 LAB — TSH: TSH: 2.87 m[IU]/L (ref 0.40–4.50)

## 2023-05-29 NOTE — Patient Instructions (Addendum)
 You have been diagnosed with dependent edema.  We have drawn lab studies today including kidney functions and thyroid functions and evaluation of this condition.  Please take Lasix as prescribed and keep legs elevated.  Watch salt intake.  Call next week with progress report.

## 2023-05-29 NOTE — Telephone Encounter (Signed)
 Called patient. Labs including thyroid functions, electrolytes, kidney functions, are all normal. Lasix was sent to [facility] pharmacy yesterday.  She should call us next week and let us know if edema is improving. MJB, MD

## 2023-06-01 NOTE — Telephone Encounter (Signed)
 [name] called today with an update, she did not get her Lassix until yesterday, the pharmacy had to order. She took one yesterday and today, so too soon to tell how it is going to work. She will call back on Friday with an update.

## 2023-06-01 NOTE — Telephone Encounter (Signed)
 Called and let her know to call back on Monday, she verbalized understanding.

## 2023-06-08 NOTE — Telephone Encounter (Signed)
 Patient called back and said she has been taking it for a week and said it is having some affect but not fully. She said she guesses there is about a 30% percent improvement but it varies on what she's doing.

## 2023-06-09 NOTE — Telephone Encounter (Signed)
 I sent a message on MyChart to patient including these instructions. I will call her and try and get her on schedule for this Friday.

## 2023-06-09 NOTE — Telephone Encounter (Signed)
 Appointment scheduled for 06/11/2023

## 2023-06-11 ENCOUNTER — Ambulatory Visit (INDEPENDENT_AMBULATORY_CARE_PROVIDER_SITE_OTHER): Admitting: Internal Medicine

## 2023-06-11 VITALS — BP 140/86 | HR 62 | Temp 96.1°F | Ht 65.0 in | Wt 191.1 lb

## 2023-06-11 DIAGNOSIS — R609 Edema, unspecified: Secondary | ICD-10-CM

## 2023-06-11 NOTE — Progress Notes (Signed)
 Patient Care Team: Sylvan Evener, MD as PCP - General (Internal Medicine)  Visit Date: 06/11/23  Subjective:   Chief Complaint  Patient presents with   Medical Management of Chronic Issues   Vitals:   06/11/23 1429 06/11/23 1442  BP: (!) 152/90 (!) 140/86  Patient ZO:[id number] Beverly [name] DOB:November 08, 1950,73 y.o. JWJ:[id number]   73 y.o. Female presents today for 2 week follow-up for Bilateral  lower extremity edema. Patient has a past medical history of ORIF Ankle Fracture, Right 2016 and Fx of 4th & 5th Metatarsals. At the last visit she had been having bilateral LE edema, a change from her usually mild chronic edema of her right ankle/foot since her fractures. She had not been doing extensively strenuous exercise, but had been going to PT and gardening on warm days. Denied chest pain or SOB. She said that her at-home BPs were ranging 118-138/65-82, so not concerning despite the systolic being slightly above normal range, and that day in office it was normo-tensive. Her labs that day (CBC, C-MET, UA, Free T4 and TSH) were all normal as well. She was prescribed Lasix 20 mg to take once daily, which she started taking on 3/31, and messaged via MyChart to inform that after taking it for 1 week she had noticed about 30% improvement only depending on her activities -- was instructed to start taking 2 tablets daily for a total of 40 mg.  She says that she has been taking 40 mg for 3 days now, and has noticed a significant improvement in her edema though they are still swelling and swelling remains fairly stable throughout the day. Blood pressure today is hypertensive at 152/90 and 140/86 13 minutes later.  Past Medical History:  Diagnosis Date   Family history of adverse reaction to anesthesia    pt's mother has hx. of post-op nausea   History of Crohn's disease    Ileostomy in place (HCC)    Lichen sclerosus    Osteopenia     Allergies  Allergen Reactions   Flagyl [Metronidazole]  Other (See Comments)    PERIPHERAL NEUROPATHY   Adhesive [Tape] Itching and Other (See Comments)    SKIN REDNESS    Family History  Problem Relation Age of Onset   Uterine cancer Mother 33   Colon cancer Mother 5   Hypertension Mother    Cancer Mother    Cancer Father    Social Hx: divorced. Retired Sports administrator.  Review of Systems  Cardiovascular:  Positive for leg swelling (bilateral, improved from 3/28).  All other systems reviewed and are negative.    Objective:  Vitals: BP (!) 152/90   Pulse 62   Temp (!) 96.1 F (35.6 C)   Ht 5\' 5"  (1.651 m)   Wt 191 lb 1.9 oz (86.7 kg)   SpO2 96%   BMI 31.80 kg/m   Physical Exam Vitals and nursing note reviewed.  Constitutional:      General: She is not in acute distress.    Appearance: Normal appearance. She is not toxic-appearing.  HENT:     Head: Normocephalic and atraumatic.  Pulmonary:     Effort: Pulmonary effort is normal.  Musculoskeletal:     Right lower leg: 2+ Pitting Edema present.     Left lower leg: 2+ Pitting Edema present.  Skin:    General: Skin is warm and dry.  Neurological:     Mental Status: She is alert and oriented to person, place, and time. Mental status is  at baseline.  Psychiatric:        Mood and Affect: Mood normal.        Behavior: Behavior normal.        Thought Content: Thought content normal.        Judgment: Judgment normal.     Results:  Studies Obtained And Personally Reviewed By Me: Labs:     Component Value Date/Time   NA 140 05/28/2023 1457   K 4.2 05/28/2023 1457   CL 104 05/28/2023 1457   CO2 25 05/28/2023 1457   GLUCOSE 89 05/28/2023 1457   BUN 16 05/28/2023 1457   CREATININE 0.69 05/28/2023 1457   CALCIUM 9.5 05/28/2023 1457   PROT 6.5 05/28/2023 1457   ALBUMIN 4.0 10/23/2014 1011   AST 23 05/28/2023 1457   ALT 17 05/28/2023 1457   ALKPHOS 110 10/23/2014 1011   BILITOT 0.4 05/28/2023 1457   GFRNONAA 78 03/22/2020 1148   GFRAA 91 03/22/2020 1148    Lab  Results  Component Value Date   WBC 6.8 05/28/2023   HGB 13.4 05/28/2023   HCT 39.3 05/28/2023   MCV 98.5 05/28/2023   PLT 291 05/28/2023   Lab Results  Component Value Date   CHOL 185 08/31/2022   HDL 110 08/31/2022   LDLCALC 60 08/31/2022   TRIG 69 08/31/2022   CHOLHDL 1.7 08/31/2022   Lab Results  Component Value Date   HGBA1C 5.5 08/14/2021    Lab Results  Component Value Date   TSH 2.87 05/28/2023   Assessment & Plan:   Bilateral LE Edema: seen initially on 3/28 for this issue, she was prescribed Lasix 20 mg to take once daily, which she started taking on 3/31, but after taking it for 1 week she had noticed about 30% improvement only depending on her activities, so was subsequently instructed to start taking 2 tablets daily for a total of 40 mg, which she has now been taking for 3 days. Noticed a significant improvement in her edema though they are still swelling, but it remains fairly stable throughout the day. Blood pressure today is elevated at 152/90 and  was 140/86  when checked 13 minutes later. Instructed her to continue Lasix 40 mg daily over the weekend and return 4/15 for follow-up as office will be closed for 1 week starting 4/16. Cmet showed normal BUN and creatinine on March 28th and potassium was normal.   I,Emily Lagle,acting as a scribe for Sylvan Evener, MD.,have documented all relevant documentation on the behalf of Sylvan Evener, MD,as directed by  Sylvan Evener, MD while in the presence of Sylvan Evener, MD.   I, Sylvan Evener, MD, have reviewed all documentation for this visit. The documentation on 06/14/23 for the exam, diagnosis, procedures, and orders are all accurate and complete.

## 2023-06-14 ENCOUNTER — Encounter: Payer: Self-pay | Admitting: Neurology

## 2023-06-14 ENCOUNTER — Ambulatory Visit (INDEPENDENT_AMBULATORY_CARE_PROVIDER_SITE_OTHER): Payer: Self-pay | Admitting: Neurology

## 2023-06-14 ENCOUNTER — Encounter: Payer: Self-pay | Admitting: Internal Medicine

## 2023-06-14 VITALS — BP 156/72 | HR 68 | Ht 65.0 in | Wt 193.0 lb

## 2023-06-14 DIAGNOSIS — R292 Abnormal reflex: Secondary | ICD-10-CM

## 2023-06-14 DIAGNOSIS — M21371 Foot drop, right foot: Secondary | ICD-10-CM | POA: Diagnosis not present

## 2023-06-14 NOTE — Progress Notes (Signed)
 Patient Care Team: Margaree Mackintosh, MD as PCP - General (Internal Medicine) Glendale Chard, DO as Consulting Physician (Neurology)  Visit Date: 06/15/23  Subjective:   Chief Complaint  Patient presents with   Follow-up   Vitals:   06/15/23 1210 06/15/23 1244  BP: (!) 152/90 132/84  Patient NG:[id number] Beverly [name] DOB:09/24/1950,73 y.o. GMW:[id number]   73 y.o. Female presents today for follow-up for Dependent Edema, Bilateral. Patient has a past medical history of ORIF Ankle Fracture, Right 2016 and Fx of 4th & 5th Metatarsals. Her bilateral edema had significantly improved from when she was first seen on 3/28, however by 4/11 she was still having some edema, so was instructed to continue Lasix 40 mg over the weekend and come back to evaluate improvement. Today says that her edema has definitely improved since she has started taking 40 mg daily. Saw Dr. Nita Sickle, Neurologist, yesterday for right foot drop thought to be possible peroneal mononeuropathy vs L5 radiculopathy. An MRI is pending. Seeing Dr.  Nita Sickle, Neurologist.. Blood pressure today was 152/90, normalized to 132/84 on recheck 34 minutes later.  Past Medical History:  Diagnosis Date   Family history of adverse reaction to anesthesia    pt's mother has hx. of post-op nausea   History of Crohn's disease    Ileostomy in place (HCC)    Lichen sclerosus    Osteopenia     Allergies  Allergen Reactions   Flagyl [Metronidazole] Other (See Comments)    PERIPHERAL NEUROPATHY   Adhesive [Tape] Itching and Other (See Comments)    SKIN REDNESS    Family History  Problem Relation Age of Onset   Uterine cancer Mother 62   Colon cancer Mother 23   Hypertension Mother    Cancer Mother    Cancer Father        prostate cancer which spread to the bones   Prostate cancer Father    Fibromyalgia Sister    Heart Problems Sister        [place] [address]   Diabetes Sister    Heart attack Sister        heart attack  at athe age of 90   Social History   Social History Narrative   Are you right handed or left handed? Right Handed   Are you currently employed ? No    What is your current occupation? Retired 10/01/2022   Do you live at home alone? Yes   Who lives with you?    What type of home do you live in: 1 story or 2 story? Lives in a one story home with a bonus room on top floor        Review of Systems  Cardiovascular:  Positive for leg swelling (improved from 4/11 since starting Lasix 40 mg).     Objective:  Vitals: BP 132/84   Pulse 61   Temp (!) 96.6 F (35.9 C) (Temporal)   Ht 5\' 5"  (1.651 m)   Wt 193 lb 6.4 oz (87.7 kg)   SpO2 95%   BMI 32.18 kg/m   Physical Exam Vitals and nursing note reviewed.  Constitutional:      General: She is not in acute distress.    Appearance: Normal appearance. She is not toxic-appearing.  HENT:     Head: Normocephalic and atraumatic.  Pulmonary:     Effort: Pulmonary effort is normal.  Musculoskeletal:     Right lower leg: 1+ Pitting Edema (improved from 4/11 since starting 40  mg Lasix, right worse than left) present.     Left lower leg: 1+ Pitting Edema (improved from 4/11 since starting 40 mg Lasix) present.  Skin:    General: Skin is warm and dry.  Neurological:     Mental Status: She is alert and oriented to person, place, and time. Mental status is at baseline.  Psychiatric:        Mood and Affect: Mood normal.        Behavior: Behavior normal.        Thought Content: Thought content normal.        Judgment: Judgment normal.     Results:  Studies Obtained And Personally Reviewed By Me: Labs:     Component Value Date/Time   NA 140 05/28/2023 1457   K 4.2 05/28/2023 1457   CL 104 05/28/2023 1457   CO2 25 05/28/2023 1457   GLUCOSE 89 05/28/2023 1457   BUN 16 05/28/2023 1457   CREATININE 0.69 05/28/2023 1457   CALCIUM 9.5 05/28/2023 1457   PROT 6.5 05/28/2023 1457   ALBUMIN 4.0 10/23/2014 1011   AST 23 05/28/2023 1457   ALT 17  05/28/2023 1457   ALKPHOS 110 10/23/2014 1011   BILITOT 0.4 05/28/2023 1457   GFRNONAA 78 03/22/2020 1148   GFRAA 91 03/22/2020 1148    Lab Results  Component Value Date   WBC 6.8 05/28/2023   HGB 13.4 05/28/2023   HCT 39.3 05/28/2023   MCV 98.5 05/28/2023   PLT 291 05/28/2023   Lab Results  Component Value Date   CHOL 185 08/31/2022   HDL 110 08/31/2022   LDLCALC 60 08/31/2022   TRIG 69 08/31/2022   CHOLHDL 1.7 08/31/2022   Lab Results  Component Value Date   HGBA1C 5.5 08/14/2021    Lab Results  Component Value Date   TSH 2.87 05/28/2023   Assessment & Plan:   Orders Placed This Encounter  Procedures   Basic metabolic panel with GFR   Dependent Edema, Bilateral: had significantly improved from when she was first seen on 3/28, however by 4/11 she was still having some edema, so was instructed to continue Lasix 40 mg over the weekend and come back to evaluate improvement. Blood pressure today was 152/90, normalized to 132/84 on recheck 34 minutes later. Today says that her edema has definitely improved since she was last seen. On exam right leg edema worse than left, but improved overall since starting 40 mg Lasix. Would like to check her Potassium level to make sure she is not becoming hypokalemic, ordering Basic Metabolic Panel w/ GFR. Continue with Lasix 40 mg daily. Basic metabolic panel drawn today.  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 Reyna Cava, Neurologist   I,Emily Lagle,acting as a scribe for Sylvan Evener, MD.,have documented all relevant documentation on the behalf of Sylvan Evener, MD,as directed by  Sylvan Evener, MD while in the presence of Sylvan Evener, MD.   I, Sylvan Evener, MD, have reviewed all documentation for this visit. The documentation on 06/15/23 for the exam, diagnosis, procedures, and orders are all accurate and complete.

## 2023-06-14 NOTE — Patient Instructions (Signed)
Nerve testing of the right leg  ELECTROMYOGRAM AND NERVE CONDUCTION STUDIES (EMG/NCS) INSTRUCTIONS  How to Prepare The neurologist conducting the EMG will need to know if you have certain medical conditions. Tell the neurologist and other EMG lab personnel if you: Have a pacemaker or any other electrical medical device Take blood-thinning medications Have hemophilia, a blood-clotting disorder that causes prolonged bleeding Bathing Take a shower or bath shortly before your exam in order to remove oils from your skin. Don't apply lotions or creams before the exam.  What to Expect You'll likely be asked to change into a hospital gown for the procedure and lie down on an examination table. The following explanations can help you understand what will happen during the exam.  Electrodes. The neurologist or a technician places surface electrodes at various locations on your skin depending on where you're experiencing symptoms. Or the neurologist may insert needle electrodes at different sites depending on your symptoms.  Sensations. The electrodes will at times transmit a tiny electrical current that you may feel as a twinge or spasm. The needle electrode may cause discomfort or pain that usually ends shortly after the needle is removed. If you are concerned about discomfort or pain, you may want to talk to the neurologist about taking a short break during the exam.  Instructions. During the needle EMG, the neurologist will assess whether there is any spontaneous electrical activity when the muscle is at rest - activity that isn't present in healthy muscle tissue - and the degree of activity when you slightly contract the muscle.  He or she will give you instructions on resting and contracting a muscle at appropriate times. Depending on what muscles and nerves the neurologist is examining, he or she may ask you to change positions during the exam.  After your EMG You may experience some temporary, minor  bruising where the needle electrode was inserted into your muscle. This bruising should fade within several days. If it persists, contact your primary care doctor.   

## 2023-06-14 NOTE — Patient Instructions (Addendum)
 Continue Lasix 40 mg daily and follow up on April 15th.

## 2023-06-14 NOTE — Progress Notes (Signed)
 [facility] Neurology [facility] Clinic Note - Initial Visit   Date: 06/14/2023   Beverly [name] MRN: [id number] DOB: [birth year]   Dear Dr. Lenord Fellers:   Thank you for your kind referral of Beverly [name] for consultation of right foot drop. Although her history is well known to you, please allow [place] to reiterate it for the purpose of our medical record. The patient was accompanied to the clinic by self.    Beverly [name] is a 73 y.o. right-handed female with history Crohn's disease s/p protocolectomy (2004) presenting for evaluation of right foot drop.   IMPRESSION/PLAN: Right foot drop - ?peroneal mononeuropathy vs L5 radiculopathy. She has been having slow improvement with PT.  NCS/EMG of the right leg will be ordered to further localize her symptoms.  If electrodiagnostic testing is nondiagnostic, she may also need additional imaging of the cervical/thoracic spine given hyperreflexia on exam.   Further recommendations pending results.  ------------------------------------------------------------- History of present illness: Starting in January 2024, she woke with severe right knee and thigh pain.  A few months later, she began having weakness in the right foot and had a few falls.  She was evaluated by orthopaedics who performed ESI which had improved her pain.  She also completed PT which helped her strength.  She has mild numbness over the dorsum of the foot.    She drinks 1-2 glasses of wine nightly.  She is retired Counsellor.  She lives alone.  Former smoker.   Out-side paper records, electronic medical record, and images have been reviewed where available and summarized as:  MRI lumbar spine wo contrast 04/15/2022: Mild bilateral L3-4 neural foraminal narrowing more pronounced on the left. Mild narrowing of the subarticular zones without spinal canal stenosis Mild right L4-5 neural foraminal narrowing. Moderate right facet arthrosis Small broad  central L5-S1 disc protrusion with punctate central annual fissure without spinal canal stenosis or contact of descending nerve roots. Minimal-mild multilevel retrolisthesis.  Lab Results  Component Value Date   HGBA1C 5.5 08/14/2021   Lab Results  Component Value Date   VITAMINB12 454 09/07/2022   Lab Results  Component Value Date   TSH 2.87 05/28/2023   No results found for: "ESRSEDRATE", "POCTSEDRATE"  Past Medical History:  Diagnosis Date   Family history of adverse reaction to anesthesia    pt's mother has hx. of post-op nausea   History of Crohn's disease    Ileostomy in place [facility])    Lichen sclerosus    Osteopenia     Past Surgical History:  Procedure Laterality Date   BREAST REDUCTION SURGERY  2011   ORIF ANKLE FRACTURE Right 06/08/2014   Procedure: OPEN REDUCTION INTERNAL FIXATION (ORIF) RIGHT BIMALLEOLAR ANKLE FRACTURE;  Surgeon: Sheral Apley, MD;  Location: [facility];  Service: Orthopedics;  Laterality: Right;   REDUCTION MAMMAPLASTY Bilateral    STRABISMUS SURGERY     X 2   TOTAL COLECTOMY  2004   TUBAL LIGATION       Medications:  Outpatient Encounter Medications as of 06/14/2023  Medication Sig   furosemide (LASIX) 20 MG tablet Take 1 tablet (20 mg total) by mouth daily. (Patient taking differently: Take 40 mg by mouth daily.)   Vitamin D, Ergocalciferol, (DRISDOL) 1.25 MG (50000 UNIT) CAPS capsule TAKE 1 CAPSULE BY MOUTH 1 TIME A WEEK   No facility-administered encounter medications on file as of 06/14/2023.    Allergies:  Allergies  Allergen Reactions   Flagyl [Metronidazole] Other (See  Comments)    PERIPHERAL NEUROPATHY   Adhesive [Tape] Itching and Other (See Comments)    SKIN REDNESS    Family History: Family History  Problem Relation Age of Onset   Uterine cancer Mother 21   Colon cancer Mother 76   Hypertension Mother    Cancer Mother    Cancer Father        prostate cancer which spread to the bones   Prostate  cancer Father    Fibromyalgia Sister    Heart Problems Sister        [place] [address]   Diabetes Sister    Heart attack Sister        heart attack at athe age of 29    Social History: Social History   Tobacco Use   Smoking status: Former    Current packs/day: 0.00    Types: Cigarettes    Quit date: 11/08/2000    Years since quitting: 22.6   Smokeless tobacco: Never  Substance Use Topics   Alcohol use: Yes    Comment: 1-2 glasses wine daily   Drug use: No   Social History   Social History Narrative   Are you right handed or left handed? Right Handed   Are you currently employed ? No    What is your current occupation? Retired 10/01/2022   Do you live at home alone? Yes   Who lives with you?    What type of home do you live in: 1 story or 2 story? Lives in a one story home with a bonus room on top floor         Vital Signs:  BP (!) 156/72   Pulse 68   Ht 5\' 5"  (1.651 m)   Wt 193 lb (87.5 kg)   SpO2 98%   BMI 32.12 kg/m   Neurological Exam: MENTAL STATUS including orientation to time, place, person, recent and remote memory, attention span and concentration, language, and fund of knowledge is normal.  Speech is not dysarthric.  CRANIAL NERVES: II:  No visual field defects.     III-IV-VI: Pupils equal round and reactive to light.  Normal conjugate, extra-ocular eye movements in all directions of gaze.  No nystagmus.  No ptosis.   V:  Normal facial sensation.    VII:  Normal facial symmetry and movements.   VIII:  Normal hearing and vestibular function.   IX-X:  Normal palatal movement.   XI:  Normal shoulder shrug and head rotation.   XII:  Normal tongue strength and range of motion, no deviation or fasciculation.  MOTOR:  No atrophy, fasciculations or abnormal movements.  No pronator drift.   Upper Extremity:  Right  Left  Deltoid  5/5   5/5   Biceps  5/5   5/5   Triceps  5/5   5/5   Infraspinatus 5/5  5/5  Medial pectoralis 5/5  5/5  Wrist extensors  5/5    5/5   Wrist flexors  5/5   5/5   Finger extensors  5/5   5/5   Finger flexors  5/5   5/5   Dorsal interossei  5/5   5/5   Abductor pollicis  5/5   5/5   Tone (Ashworth scale)  0  0   Lower Extremity:  Right  Left  Hip flexors  5/5   5/5   Hip extensors  5/5   5/5   Knee flexors  5/5   5/5   Knee extensors  5/5  5/5   Inversion 5/5  5/5  Eversion 4/5  5/5  Dorsiflexors  4/5   5/5   Plantarflexors  5/5   5/5   Toe extensors  4+/5   5/5   Toe flexors  5/5   5/5   Tone (Ashworth scale)  0  0   MSRs:                                           Right        Left brachioradialis 3+  3+  biceps 3+  3+  triceps 3+  3+  patellar 3+  3+  ankle jerk 2+  2+  Hoffman no  no  plantar response down  down   SENSORY:  Normal and symmetric perception of light touch, pinprick, vibration, and temperature.     COORDINATION/GAIT: Normal finger-to- nose-finger.  Intact rapid alternating movements bilaterally.  Able to rise from a chair without using arms.  There is mild steppage of the right foot, otherwise gait is stable.  She is unable to heel walk on the right.     Thank you for allowing me to participate in patient's care.  If I can answer any additional questions, I would be pleased to do so.    Sincerely,    Jette Lewan K. Lydia Sams, DO

## 2023-06-15 ENCOUNTER — Encounter: Payer: Self-pay | Admitting: Internal Medicine

## 2023-06-15 ENCOUNTER — Ambulatory Visit (INDEPENDENT_AMBULATORY_CARE_PROVIDER_SITE_OTHER): Admitting: Internal Medicine

## 2023-06-15 VITALS — BP 132/84 | HR 61 | Temp 96.6°F | Ht 65.0 in | Wt 193.4 lb

## 2023-06-15 DIAGNOSIS — R609 Edema, unspecified: Secondary | ICD-10-CM

## 2023-06-15 NOTE — Patient Instructions (Signed)
 Basic metabolic panel ordered today as she is on Lasix 40 mg daily. Will advise further after results available. Watch salt intake and keep right leg elevated as much as possible.

## 2023-06-16 LAB — BASIC METABOLIC PANEL WITH GFR
BUN: 25 mg/dL (ref 7–25)
CO2: 20 mmol/L (ref 20–32)
Calcium: 10.3 mg/dL (ref 8.6–10.4)
Chloride: 106 mmol/L (ref 98–110)
Creat: 0.87 mg/dL (ref 0.60–1.00)
Glucose, Bld: 100 mg/dL — ABNORMAL HIGH (ref 65–99)
Potassium: 4.3 mmol/L (ref 3.5–5.3)
Sodium: 147 mmol/L — ABNORMAL HIGH (ref 135–146)
eGFR: 70 mL/min/{1.73_m2} (ref >=60)

## 2023-06-17 ENCOUNTER — Ambulatory Visit (INDEPENDENT_AMBULATORY_CARE_PROVIDER_SITE_OTHER): Admitting: Neurology

## 2023-06-17 DIAGNOSIS — R292 Abnormal reflex: Secondary | ICD-10-CM

## 2023-06-17 DIAGNOSIS — M21371 Foot drop, right foot: Secondary | ICD-10-CM | POA: Diagnosis not present

## 2023-06-17 DIAGNOSIS — G5731 Lesion of lateral popliteal nerve, right lower limb: Secondary | ICD-10-CM

## 2023-06-17 NOTE — Procedures (Signed)
  [facility] Neurology  [address] [place], [address]  [place], [place] [address] Tel: [phone] Fax: [phone] Test Date:  06/17/2023  Patient: Beverly [name] DOB: [birth year] Physician: Reyna Cava, DO  Sex: Female Height: 5\' 5"  Ref Phys: Reyna Cava, DO  ID#: [id number]   Technician:    History: This is a 73 year old female referred for evaluation of right foot drop.  NCV & EMG Findings: Extensive electrodiagnostic testing of the right lower extremity shows:  Right superficial peroneal sensory response is absent.  Right sural sensory response is within normal limits. Right peroneal motor response at the extensor digitorum brevis is absent, and reduced at the tibialis anterior.  Right tibial motor responses within normal limits. Active on chronic motor axonal loss changes are seen affecting the anterior tibialis, fibularis longus, and extensor hallucis longus muscles.    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  Overall, these findings are moderate-to-severe in degree electrically.   ___________________________ Reyna Cava, DO    Nerve Conduction Studies   Stim Site NR Peak (ms) Norm Peak (ms) O-P Amp (V) Norm O-P Amp  Right Sup Peroneal Anti Sensory (Ant Lat Mall)  32 C  12 cm *NR  <4.6  >3  Right Sural Anti Sensory (Lat Mall)  32 C  Calf    3.5 <4.6 6.7 >3     Stim Site NR Onset (ms) Norm Onset (ms) O-P Amp (mV) Norm O-P Amp Site1 Site2 Delta-0 (ms) Dist (cm) Vel (m/s) Norm Vel (m/s)  Right Peroneal Motor (Ext Dig Brev)  32 C  Ankle *NR  <6.0  >2.5 B Fib Ankle  0.0  >40  B Fib *NR     Poplt B Fib  0.0  >40  Poplt *NR            Right Peroneal TA Motor (Tib Ant)  32 C  Fib Head    4.0 <4.5 *2.5 >3 Poplit Fib Head 1.3 7.0 54 >40  Poplit    5.3 <5.7 1.8         Right Tibial Motor (Abd Hall Brev)  32 C  Ankle    5.0 <6.0 6.0 >4 Knee Ankle 8.4 38.0 45 >40  Knee     13.4  5.0          Electromyography   Side Muscle Ins.Act Fibs Fasc Recrt Amp Dur Poly Activation Comment  Right AntTibialis Nml *1+ Nml *2- *1+ *1+ *1+ Nml N/A  Right Gastroc Nml Nml Nml Nml Nml Nml Nml Nml N/A  Right Flex Dig Long Nml Nml Nml Nml Nml Nml Nml Nml N/A  Right RectFemoris Nml Nml Nml Nml Nml Nml Nml Nml N/A  Right BicepsFemS Nml Nml Nml Nml Nml Nml Nml Nml N/A  Right GluteusMed Nml Nml Nml Nml Nml Nml Nml Nml N/A  Right ExtHallLong Nml *2+ Nml *SMU *1+ *1+ *1+ Nml N/A  Right Fibularis Long Nml *1+ Nml *3- *1+ *1+ *1+ Nml N/A      Waveforms:

## 2023-07-13 ENCOUNTER — Ambulatory Visit (INDEPENDENT_AMBULATORY_CARE_PROVIDER_SITE_OTHER): Admitting: Neurology

## 2023-07-13 ENCOUNTER — Other Ambulatory Visit: Admitting: Neurology

## 2023-07-13 ENCOUNTER — Ambulatory Visit: Payer: Self-pay | Admitting: Neurology

## 2023-07-13 DIAGNOSIS — R292 Abnormal reflex: Secondary | ICD-10-CM

## 2023-07-13 DIAGNOSIS — M21371 Foot drop, right foot: Secondary | ICD-10-CM | POA: Diagnosis not present

## 2023-07-13 NOTE — Procedures (Signed)
  [facility]  [address] [place], [address]  [place], [place] [address] Tel: [phone] Fax: [phone] Test Date:  07/13/2023  Patient: Beverly [name] DOB: 05-19-1950 Physician: Rommie Coats, MD  Sex: Female Height: 5\' 5"  Ref Phys: Reyna Cava, DO  ID#: [id number]   Technician:    History: This is a 73 year old female with right foot drop.  Findings: High frequency (4.0-16.0 MHz) B-mode, nonvascular ultrasound of right lower limb shows: Cross sectional area of peroneal/fibular (popliteal fossa to fibular head) is increased at the fibular head (21.4 mm2). The nerve is hypoechoic at the fibular head with loss of fascicular structure as well. No other obvious lesion involving the adjacent bone or tendon is identified. No definite vascular abnormalities.  Impression: This is an abnormal study. 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 There are no appreciated abnormal lesions or direction compression on the nerve seen.    _______________  Rommie Coats, MD [facility]   Nerve Measurements   Site Area Mobility Vascularity Comment   mm Norm     Right Fibular  Fib head *21.4  < 17.8    Hypoechoic; loss of fascicular structure  Pop fossa 14.0  < 20.9       Ultrasound Images:

## 2023-07-15 ENCOUNTER — Telehealth: Payer: Self-pay | Admitting: Internal Medicine

## 2023-07-15 ENCOUNTER — Encounter: Payer: Self-pay | Admitting: Internal Medicine

## 2023-07-15 ENCOUNTER — Other Ambulatory Visit: Payer: Self-pay | Admitting: Internal Medicine

## 2023-07-15 DIAGNOSIS — R609 Edema, unspecified: Secondary | ICD-10-CM

## 2023-07-15 MED ORDER — FUROSEMIDE 40 MG PO TABS: 40.0000 mg | ORAL_TABLET | Freq: Every day | ORAL | 0 refills | Status: DC

## 2023-07-15 NOTE — Telephone Encounter (Signed)
 Patient called to get refill on furosemide  (LASIX ) 20 MG tablet. She said it had been upped last time to her taking 40mg  a day and needed a new script sent in to [id number] - [place], [place] - [address] AT [facility] OF [address].   She also wants to know how often she needs to have her blood checked with this new medicine. Please advise

## 2023-07-15 NOTE — Telephone Encounter (Signed)
 Patient still experiencing LE edema requiring lasix  40 mg daily. Asking about a refill on this medication. Spoke with her by phone today. We will want to see her again and recheck edema and a B-met and BNP. She does not have to fast. I will send in a refill on Lasix  40 mg daily today.Does not need to fast at upcoming appt.  MJB, MD

## 2023-07-19 ENCOUNTER — Other Ambulatory Visit

## 2023-07-19 DIAGNOSIS — R609 Edema, unspecified: Secondary | ICD-10-CM

## 2023-07-20 ENCOUNTER — Ambulatory Visit: Payer: Self-pay | Admitting: Internal Medicine

## 2023-07-21 LAB — BASIC METABOLIC PANEL WITH GFR
BUN: 23 mg/dL (ref 7–25)
CO2: 28 mmol/L (ref 20–32)
Calcium: 9.4 mg/dL (ref 8.6–10.4)
Chloride: 103 mmol/L (ref 98–110)
Creat: 0.9 mg/dL (ref 0.60–1.00)
Glucose, Bld: 111 mg/dL — ABNORMAL HIGH (ref 65–99)
Potassium: 4.3 mmol/L (ref 3.5–5.3)
Sodium: 140 mmol/L (ref 135–146)
eGFR: 67 mL/min/{1.73_m2} (ref >=60)

## 2023-07-21 LAB — BRAIN NATRIURETIC PEPTIDE: Brain Natriuretic Peptide: 17 pg/mL (ref <100)

## 2023-07-21 NOTE — Progress Notes (Signed)
 Patient Care Team: Beverly Evener, MD as PCP - General (Internal Medicine) [name], [name], DO as Consulting Physician (Neurology)  Visit Date: 07/22/23  Subjective:   Chief Complaint  Patient presents with   Results   Lower extremity edema follow up   Patient ZO:XWRUEAVWU Beverly, [name] DOB:1950-03-10,73 y.o. JWJ:[id number]   73 y.o.Female presents today for 1 month follow-up for Bilateral LE Edema. Patient has a past medical history of ORIF Ankle Fracture, Right 2016; Fx of 4th/5th Metatarsals. Always has some mild swelling in her right ankle and foot due to her prior fracture, but since 05/28/2023 has been having issues with bilateral LE edema, but no other cardiac symptoms and at home blood pressure within normal range majority of the time, though elevated in-office initially but normalized after some time. Was started on Lasix  20 mg, increased to 40 mg on 4/11 with improvement noted on 4/15. Labs (CBC, C-MET, UA, T4, and TSH) were WNL on 3/28 and 07/19/2023 B-MET WNL, except for Glucose 111. BNP 17. Today, she says that her edema has definitely improved, has been taking Lasix  40 mg daily. Blood pressure today normal at 130/80.  On 06/11/2023, she also mentioned that she had Right Foot Drop. 4/14 she saw Dr. Lydia [name] regarding this, had nerve conduction studies and electromyography on 4/17 and neuromuscular US  on 5/13. Apparently has a pinched nerve on the side of her knee.  Past Medical History:  Diagnosis Date   Family history of adverse reaction to anesthesia    pt's mother has hx. of post-op nausea   History of Crohn's disease    Ileostomy in place (HCC)    Lichen sclerosus    Osteopenia     Allergies  Allergen Reactions   Flagyl [Metronidazole] Other (See Comments)    PERIPHERAL NEUROPATHY   Adhesive [Tape] Itching and Other (See Comments)    SKIN REDNESS    Family History  Problem Relation Age of Onset   Uterine cancer Mother 68   Colon cancer Mother 55   Hypertension  Mother    Cancer Mother    Cancer Father        prostate cancer which spread to the bones   Prostate cancer Father    Fibromyalgia Sister    Heart Problems Sister        Marnette Sinner Bypass   Diabetes Sister    Heart attack Sister        heart attack at athe age of 64   Social Hx: Retired Sports administrator. Resides alone.  Review of Systems  Cardiovascular:  Leg swelling: improved.  Musculoskeletal:        (+) Right Foot Drop     Objective:  Vitals: BP 130/80   Pulse 82   Ht 5\' 5"  (1.651 m)   Wt 189 lb (85.7 kg)   SpO2 98%   BMI 31.45 kg/m   Physical Exam Vitals and nursing note reviewed.  Constitutional:      General: She is not in acute distress.    Appearance: Normal appearance. She is not toxic-appearing.  HENT:     Head: Normocephalic and atraumatic.  Cardiovascular:     Rate and Rhythm: Normal rate and regular rhythm. Occasional Extrasystoles are present.    Pulses: Normal pulses.     Heart sounds: Normal heart sounds. No murmur heard.    No friction rub. No gallop.  Pulmonary:     Effort: Pulmonary effort is normal. No respiratory distress.     Breath sounds:  Normal breath sounds. No wheezing or rales.  Musculoskeletal:     Right lower leg: Edema (no pitting) present.     Left lower leg: Edema (no pitting) present.  Skin:    General: Skin is warm and dry.  Neurological:     Mental Status: She is alert and oriented to person, place, and time. Mental status is at baseline.  Psychiatric:        Mood and Affect: Mood normal.        Behavior: Behavior normal.        Thought Content: Thought content normal.        Judgment: Judgment normal.     Results:  Studies Obtained And Personally Reviewed By Me:  06/17/2023 Nerve Conduction Study & Electromyography NCV & EMG Findings: Extensive electrodiagnostic testing of the right lower extremity shows:  Right superficial peroneal sensory response is absent.  Right sural sensory response is within normal  limits. Right peroneal motor response at the extensor digitorum brevis is absent, and reduced at the tibialis anterior.  Right tibial motor responses within normal limits. Active on chronic motor axonal loss changes are seen affecting the anterior tibialis, fibularis longus, and extensor hallucis longus muscles.     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  Overall, these findings are moderate-to-severe in degree electrically.  07/13/2023 Neuromuscular Ultrasound Findings: High frequency (4.0-16.0 MHz) B-mode, nonvascular ultrasound of right lower limb shows: Cross sectional area of peroneal/fibular (popliteal fossa to fibular head) is increased at the fibular head (21.4 mm2). The nerve is hypoechoic at the fibular head with loss of fascicular structure as well. No other obvious lesion involving the adjacent bone or tendon is identified. No definite vascular abnormalities.   Impression: This is an abnormal study. 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 There are no appreciated abnormal lesions or direction compression on the nerve seen.    Labs:     Component Value Date/Time   NA 140 07/19/2023 1143   [name] 4.3 07/19/2023 1143   CL 103 07/19/2023 1143   CO2 28 07/19/2023 1143   GLUCOSE 111 (H) 07/19/2023 1143   BUN 23 07/19/2023 1143   CREATININE 0.90 07/19/2023 1143   CALCIUM 9.4 07/19/2023 1143   PROT 6.5 05/28/2023 1457   ALBUMIN 4.0 10/23/2014 1011   AST 23 05/28/2023 1457   ALT 17 05/28/2023 1457   ALKPHOS 110 10/23/2014 1011   BILITOT 0.4 05/28/2023 1457   GFRNONAA 78 03/22/2020 1148   GFRAA 91 03/22/2020 1148    Lab Results  Component Value Date   WBC 6.8 05/28/2023   HGB 13.4 05/28/2023   HCT 39.3 05/28/2023   MCV 98.5 05/28/2023   PLT 291 05/28/2023   Lab Results  Component Value Date   CHOL 185 08/31/2022   HDL 110 08/31/2022    LDLCALC 60 08/31/2022   TRIG 69 08/31/2022   CHOLHDL 1.7 08/31/2022   Lab Results  Component Value Date   HGBA1C 5.5 08/14/2021    Lab Results  Component Value Date   TSH 2.87 05/28/2023    Assessment & Plan:   Bilateral LE Edema: history of ORIF Ankle Fracture, Right 2016; Fx of 4th/5th Metatarsals. Always has some mild swelling in her right ankle and foot due to her prior fracture, but since 05/28/2023 has been having issues with bilateral LE edema, but no other cardiac symptoms and at home blood pressure within normal  range majority of the time, though elevated in-office initially but normalized after some time. Was started on Lasix  20 mg, increased to 40 mg on 4/11 with improvement noted on 4/15. Labs (CBC, C-MET, UA, T4, and TSH) were WNL on 3/28 and 07/19/2023 B-MET WNL, except for Glucose 111. BNP 17. Today, her edema has definitely improved, has been taking Lasix  40 mg daily. Blood pressure today normal at 130/80.  Right Foot Drop seen by Dr. Lydia [name] regarding this, had nerve conduction studies and electromyography on 4/17 and neuromuscular US  on 5/13. Apparently has a pinched nerve on the side of her knee.    Plan: follow up in August for Welcome to Medicare Exam. Continue diuretic therapy     I,Emily Lagle,acting as a scribe for Beverly Evener, MD.,have documented all relevant documentation on the behalf of Beverly Evener, MD,as directed by  Beverly Evener, MD while in the presence of Beverly Evener, MD.   I, Beverly Evener, MD, have reviewed all documentation for this visit. The documentation on 07/28/23 for the exam, diagnosis, procedures, and orders are all accurate and complete.

## 2023-07-22 ENCOUNTER — Ambulatory Visit (INDEPENDENT_AMBULATORY_CARE_PROVIDER_SITE_OTHER): Admitting: Internal Medicine

## 2023-07-22 ENCOUNTER — Encounter: Payer: Self-pay | Admitting: Internal Medicine

## 2023-07-22 VITALS — BP 130/80 | HR 82 | Ht 65.0 in | Wt 189.0 lb

## 2023-07-22 DIAGNOSIS — M21371 Foot drop, right foot: Secondary | ICD-10-CM | POA: Diagnosis not present

## 2023-07-22 DIAGNOSIS — R609 Edema, unspecified: Secondary | ICD-10-CM | POA: Diagnosis not present

## 2023-07-28 ENCOUNTER — Encounter: Payer: Self-pay | Admitting: Internal Medicine

## 2023-07-28 NOTE — Patient Instructions (Signed)
 As always, it was a pleasure to see you. Glad the lower extremity edema has improved. Continue Lasix . Potassium and kidney functions are normal. We will check these again in August when you return for Welcome to medicare visit with fasting labs.

## 2023-07-29 ENCOUNTER — Other Ambulatory Visit: Payer: Self-pay | Admitting: Internal Medicine

## 2023-09-27 ENCOUNTER — Ambulatory Visit (INDEPENDENT_AMBULATORY_CARE_PROVIDER_SITE_OTHER): Admitting: Neurology

## 2023-09-27 ENCOUNTER — Encounter: Admitting: Obstetrics and Gynecology

## 2023-09-27 VITALS — BP 141/63 | HR 75 | Ht 65.0 in | Wt 189.0 lb

## 2023-09-27 DIAGNOSIS — G5731 Lesion of lateral popliteal nerve, right lower limb: Secondary | ICD-10-CM | POA: Diagnosis not present

## 2023-09-27 NOTE — Progress Notes (Signed)
 Follow-up Visit   Date: 09/27/2023    Beverly [name] MRN: [id number] DOB: 1951-02-22    Beverly [name] is a 73 y.o. right-handed Caucasian female with history Crohn's disease s/p protocolectomy (2004) returning to the clinic for follow-up of right peroneal mononeuropathy.  The patient was accompanied to the clinic by self.  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  She has completed PT and made slow recovery.  Exam shows trace weakness with right foot dorsiflexion, eversion, and toe extension. No sensory loss is present.   - Continue home physical therapy exercises  - Avoid crossing the legs  Return to clinic in 6 months  --------------------------------------------- History of present illness: Starting in January 2024, she woke with severe right knee and thigh pain.  A few months later, she began having weakness in the right foot and had a few falls.  She was evaluated by orthopaedics who performed ESI which had improved her pain.  She also completed PT which helped her strength.  She has mild numbness over the dorsum of the foot.     She drinks 1-2 glasses of wine nightly.  She is retired Counsellor.  She lives alone.  Former smoker.   UPDATE 09/27/2023: She is here for follow-up visit.  She has completed PT and reports some improvement.  The recovery is slow.  She is able to move the right foot better. No falls.  No new numbness/tingling in the leg.    Medications:  Current Outpatient Medications on File Prior to Visit  Medication Sig Dispense Refill   furosemide  (LASIX ) 40 MG tablet Take 1 tablet (40 mg total) by mouth daily. 90 tablet 0   Vitamin D , Ergocalciferol , (DRISDOL ) 1.25 MG (50000 UNIT) CAPS capsule TAKE 1 CAPSULE BY MOUTH 1 TIME A WEEK 12 capsule 3   No current facility-administered medications on file prior to visit.    Allergies:  Allergies  Allergen  Reactions   Flagyl [Metronidazole] Other (See Comments)    PERIPHERAL NEUROPATHY   Adhesive [Tape] Itching and Other (See Comments)    SKIN REDNESS    Vital Signs:  BP (!) 141/63   Pulse 75   Ht 5' 5 (1.651 m)   Wt 189 lb (85.7 kg)   SpO2 98%   BMI 31.45 kg/m   Neurological Exam: MENTAL STATUS including orientation to time, place, person, recent and remote memory, attention span and concentration, language, and fund of knowledge is normal.  Speech is not dysarthric.  CRANIAL NERVES:  Pupils equal round and reactive to light.  Normal conjugate, extra-ocular eye movements in all directions of gaze.  No ptosis.  Face is symmetric.   MOTOR:  Motor strength is 5/5 in all extremities, except dorsiflexion, eversion, and toe extension is 5-/5.  No atrophy, fasciculations or abnormal movements.  No pronator drift.  Tone is normal.    MSRs:  Reflexes are 3+/4 throughout, except 2+/4 at the ankles.  SENSORY:  Intact to vibration and temperature throughout.  COORDINATION/GAIT:  Normal finger-to- nose-finger.  Intact rapid alternating movements bilaterally.  Gait narrow based and stable. She is able to stand on heels and toes.   Data: NCS/EMG of the right leg 06/17/2023: Right common peroneal mononeuropathy above the takeoff to the tibialis anterior muscle with evidence of ongoing denervation.  Overall, these findings are moderate-to-severe in degree electrically.  US  right peroneal nerve 07/13/2023: This is an abnormal study. 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 There are no appreciated abnormal lesions or direction compression on the nerve seen.   MRI lumbar spine wo contrast 04/15/2022: Mild bilateral L3-4 neural foraminal narrowing more pronounced on the left. Mild narrowing of the subarticular zones without spinal canal stenosis Mild right L4-5 neural foraminal narrowing. Moderate right facet arthrosis Small broad central L5-S1 disc  protrusion with punctate central annual fissure without spinal canal stenosis or contact of descending nerve roots. Minimal-mild multilevel retrolisthesis.   Thank you for allowing me to participate in patient's care.  If I can answer any additional questions, I would be pleased to do so.    Sincerely,    Tarea Skillman K. Tobie, DO

## 2023-09-30 NOTE — Progress Notes (Unsigned)
 73 y.o. [id number] Unknown Caucasian female here for a breast and pelvic exam.  Prior patient of Dr. Lindbergh.   The patient is also followed for Est. Care and talk about lichens sclerosis vulva. Hasn't had a pelvic or pap in 6 or 7 years.   No vaginal discharge or bleeding.   Not using temovate  regularly.  Leakage or urine with position changes, cough or sneeze.  Some key in lock leakage.    Has sciatica and has done PT.   Lived in [place].  From [place]  originally.  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   PCP: Perri Ronal PARAS, MD   No LMP recorded. Patient is postmenopausal.           Sexually active: No.  The current method of family planning is NA.    Menopausal hormone therapy:  NA Exercising: Yes.    Goes to [employer] 2-3 times a week Smoker:  no  OB History     Gravida  3   Para  2   Term  2   Preterm      AB  1   Living  2      SAB      IAB      Ectopic      Multiple      Live Births  2           HEALTH MAINTENANCE: Last 2 paps: over 6 or 7 years ago  History of abnormal Pap or positive HPV:   Mammogram:  09/21/22-Breast Density Cat. A  BI-Rads Cat. 1 Neg.  She will schedule at [facility].   Colonoscopy:  NA.  Status post colectomy and has ileostomy.  Small rectal stump.  Sees Dr. Rosalie.   Bone Density:  09/03/21  Result  Osteopenic low bone mass.  FRAX 10.4%/1.7%.  Followed by PCP.    Immunization History  Administered Date(s) Administered   Fluad  Trivalent(High Dose 65+) 01/05/2023   Influenza, High Dose Seasonal PF 01/17/2019   Influenza,inj,Quad PF,6+ Mos 01/17/2019, 12/18/2021   Influenza-Unspecified 12/13/2013, 12/15/2016, 12/15/2017, 12/12/2019   PFIZER Comirnaty Alejos Top)Covid-19 Tri-Sucrose Vaccine 09/13/2020   PFIZER(Purple Top)SARS-COV-2 Vaccination 04/30/2019, 05/31/2019, 01/13/2020   Pfizer Covid-19 Vaccine Bivalent Booster 58yrs & up 01/06/2021   Pfizer(Comirnaty )Fall Seasonal Vaccine 12 years and older 02/09/2022,  01/05/2023   Pneumococcal Conjugate-13 12/19/2014   Pneumococcal Polysaccharide-23 02/21/2019   Tdap 04/16/2005, 09/07/2022   Zoster, Live 02/07/2012      reports that she quit smoking about 22 years ago. Her smoking use included cigarettes. She has never used smokeless tobacco. She reports current alcohol use. She reports that she does not use drugs.  Past Medical History:  Diagnosis Date   Family history of adverse reaction to anesthesia    pt's mother has hx. of post-op nausea   History of Crohn's disease    Ileostomy in place [facility])    Lichen sclerosus    Osteopenia     Past Surgical History:  Procedure Laterality Date   BREAST REDUCTION SURGERY  2011   ORIF ANKLE FRACTURE Right 06/08/2014   Procedure: OPEN REDUCTION INTERNAL FIXATION (ORIF) RIGHT BIMALLEOLAR ANKLE FRACTURE;  Surgeon: Evalene JONETTA Chancy, MD;  Location: [facility];  Service: Orthopedics;  Laterality: Right;   REDUCTION MAMMAPLASTY Bilateral    STRABISMUS SURGERY     X 2   TOTAL COLECTOMY  2004   TUBAL LIGATION      Current Outpatient Medications  Medication Sig Dispense Refill  furosemide  (LASIX ) 40 MG tablet Take 1 tablet (40 mg total) by mouth daily. 90 tablet 0   Vitamin D , Ergocalciferol , (DRISDOL ) 1.25 MG (50000 UNIT) CAPS capsule TAKE 1 CAPSULE BY MOUTH 1 TIME A WEEK 12 capsule 3   No current facility-administered medications for this visit.    ALLERGIES: Flagyl [metronidazole] and Adhesive [tape]  Family History  Problem Relation Age of Onset   Uterine cancer Mother 25   Colon cancer Mother 68   Hypertension Mother    Cancer Mother    Cancer Father        prostate cancer which spread to the bones   Prostate cancer Father    Fibromyalgia Sister    Heart Problems Sister        Melida Bypass   Diabetes Sister    Heart attack Sister        heart attack at athe age of 76    Review of Systems  PHYSICAL EXAM:  BP 128/78   Pulse 63   Ht 5' 5.75 (1.67 m)   Wt 185 lb 12.8  oz (84.3 kg)   SpO2 98%   BMI 30.22 kg/m     General appearance: alert, cooperative and appears stated age Head: normocephalic, without obvious abnormality, atraumatic Neck: no adenopathy, supple, symmetrical, trachea midline and thyroid normal to inspection and palpation Lungs: clear to auscultation bilaterally Breasts: consistent with bilateral reduction, no masses or tenderness, No nipple retraction or dimpling, No nipple discharge or bleeding, No axillary adenopathy Heart: regular rate and rhythm Abdomen: ileostomy present, soft, non-tender; no masses, no organomegaly Extremities: extremities normal, atraumatic, no cyanosis or edema Skin: skin color, texture, turgor normal. No rashes or lesions Lymph nodes: cervical, supraclavicular, and axillary nodes normal. Neurologic: grossly normal  Pelvic: External genitalia:  no lesions.  Fusing of the labia majora and majora on each ipsilateral side.               No abnormal inguinal nodes palpated.              Urethra:  normal appearing urethra with no masses, tenderness or lesions              Bartholins and Skenes: normal                 Vagina: normal appearing vagina with normal color and discharge, no lesions              Cervix: no lesions              Pap taken: yes Bimanual Exam:  Uterus:  normal size, contour, position, consistency, mobility, non-tender              Adnexa: no mass, fullness, tenderness              Rectal exam: yes.  Very short length.              Anus:  normal sphincter tone, no lesions  Chaperone was present for exam:  Geni CROME, CMA  ASSESSMENT: Encounter for breast and pelvic exam.  Lichen sclerosus. Cervical cancer screening.  Mixed incontinence.  Status post breast reduction.  Chrohn's. Status post total colectomy.  Has ileostomy.  Osteopenia. Hx Fosamax use.  Hx fracture.  FH uterine and colon cancer in mother.  FH prostate cancer in father.   PLAN: Mammogram screening discussed. Self breast  awareness reviewed. Pap and HRV collected:  yes Guidelines for Calcium, Vitamin D , regular exercise program including cardiovascular and  weight bearing exercise. Medication:  Valisone ointment, apply twice daily for 2 weeks as needed and then twice weekly as needed.  Stress incontinence and overactive bladder reviewed briefly.  Caffeine reduction mentioned as way to reduce overactive bladder.  Referral for pelvic floor therapy.  Bone density recommended.  PCP following.  Follow up:  1 year and prn.     Additional counseling given.  yes. 30  total time was spent for this patient encounter, including preparation, face-to-face counseling with the patient, coordination of care, and documentation of the encounter in addition to doing the breast and pelvic exam.

## 2023-10-01 ENCOUNTER — Ambulatory Visit (INDEPENDENT_AMBULATORY_CARE_PROVIDER_SITE_OTHER): Admitting: Obstetrics and Gynecology

## 2023-10-01 ENCOUNTER — Encounter: Payer: Self-pay | Admitting: Obstetrics and Gynecology

## 2023-10-01 ENCOUNTER — Telehealth: Payer: Self-pay | Admitting: Obstetrics and Gynecology

## 2023-10-01 ENCOUNTER — Other Ambulatory Visit (HOSPITAL_COMMUNITY)
Admission: RE | Admit: 2023-10-01 | Discharge: 2023-10-01 | Disposition: A | Discharge status: Home / Self Care | Source: Ambulatory Visit | Attending: Obstetrics and Gynecology | Admitting: Obstetrics and Gynecology

## 2023-10-01 VITALS — BP 128/78 | HR 63 | Ht 65.75 in | Wt 185.8 lb

## 2023-10-01 DIAGNOSIS — N3946 Mixed incontinence: Secondary | ICD-10-CM

## 2023-10-01 DIAGNOSIS — L900 Lichen sclerosus et atrophicus: Secondary | ICD-10-CM

## 2023-10-01 DIAGNOSIS — Z1151 Encounter for screening for human papillomavirus (HPV): Secondary | ICD-10-CM | POA: Diagnosis not present

## 2023-10-01 DIAGNOSIS — Z124 Encounter for screening for malignant neoplasm of cervix: Secondary | ICD-10-CM | POA: Diagnosis present

## 2023-10-01 DIAGNOSIS — Z01419 Encounter for gynecological examination (general) (routine) without abnormal findings: Secondary | ICD-10-CM | POA: Diagnosis present

## 2023-10-01 MED ORDER — BETAMETHASONE VALERATE 0.1 % EX OINT: 1.0000 | TOPICAL_OINTMENT | Freq: Two times a day (BID) | CUTANEOUS | 1 refills | Status: AC

## 2023-10-01 NOTE — Telephone Encounter (Signed)
 Please make referral for pelvic floor therapy at [facility].   Diagnosis is mixed incontinence.

## 2023-10-01 NOTE — Telephone Encounter (Signed)
Referral placed.   Routing to [employer].   Encounter closed.

## 2023-10-01 NOTE — Patient Instructions (Addendum)
 Urinary Incontinence: What to Know Urinary incontinence, or UI, happens if you can't always control when you pee. If you think you have UI, it's important to talk to your health care provider. There're things that can be done to help treat your problem. What are the different types of UI? There are many types of UI. You may have more than one type: Stress UI. You pee all of a sudden when doing activities such exercising. You may also pee when you cough or laugh. Urge UI. You have a sudden urge to pee and you begin peeing before you get to a bathroom. Overflow UI. You feel the need to pee often but only pee a small amount. You also always leak urine. Functional UI. You pee when you can't get to the bathroom in time due to: A physical problem like a bladder injury or arthritis. A disease that affects the way your brain works, like Alzheimer's disease. Nocturnal UI. You pee while you sleep. What are the causes?     Medicines. Infections. Trouble pooping (constipation). Bladder muscles that squeeze too much or too soon (overactive bladder). Weak bladder muscles. Weak pelvic floor muscles. These muscles provide support for the bladder, intestines, and the uterus. Enlarged prostate in males. The prostate sits around the tube that drains pee from the bladder (urethra). If the prostate gets big, it can pinch the urethra. If the urethra is blocked, the bladder gets weak and can't empty all the way. Surgery. Diseases that affect the nerves or spinal cord. What puts you at risk for UI? Age. The older you are, the higher the risk. Being overweight or having obesity. Being inactive and not getting much exercise. Pregnancy and childbirth. Menopause. Long-term coughing. Treatment for prostate cancer. How is UI diagnosed? A physical exam. Pee tests. X-rays of your kidneys or bladder. Ultrasound or CT scan. Cystoscopy. In this procedure, a provider inserts a tube with a light and camera  (cystoscope) through the urethra and into the bladder to check for problems. Urodynamic testing. These tests check how well the bladder can store and release pee. Your provider may ask you to write down your symptoms. You may be asked to write when you pee and how much you pee. Where to find more information [employer] of Diabetes and Digestive and Kidney Diseases: CarFlippers.tn American Urology Association: www.urologyhealth.org This information is not intended to replace advice given to you by your health care provider. Make sure you discuss any questions you have with your health care provider. Document Revised: 01/20/2023 Document Reviewed: 10/23/2022 Elsevier Patient Education  2024 Elsevier Inc.  [place], Painful, White Skin Patches (Lichen Sclerosus): What to Know Lichen sclerosus is a skin problem. It can happen on any part of your body, but often affects the areas around your genitals or the opening of your butt (anus). Treatment can help to control symptoms. It can also help prevent scarring that may lead to other problems. Lichen sclerosus isn't an infection or a fungus. It can't be passed from person to person. What are the causes? The cause of this condition isn't known. It may be related to: The body's defense system, also called the immune system, reacting too strongly. A lack of certain hormones. What increases the risk? You're more likely to get this condition if: You're a female who has stopped having periods. This is called menopause. You're a female who wasn't circumcised. You're a child who hasn't reached puberty yet. What are the signs or symptoms?  Plaques. These are  areas on the skin that are: White. Thin. Wrinkled. Thickened. Red and swollen patches on the skin. Tears or cracks in the skin. Bruising. Blood blisters. Very bad itching. Pain, itching, or burning when peeing. Trouble pooping (constipation), especially in children. How is this  diagnosed? This condition may be diagnosed with a physical exam. Sometimes, a tissue sample is taken and checked under a microscope. This is called a biopsy. How is this treated? This condition may be treated with: Topical steroids. These are creams and ointments that are put on the skin. Medicines taken by mouth. Topical immunotherapy. These are creams and ointments that are put on the skin to stimulate the body's defense system. Surgery. This may need to be done if there are problems like scarring. Follow these instructions at home: Medicines Take or apply your medicines only as told. Use creams or ointments as told by your health care provider. Skin care Do not scratch the affected areas. Keep the affected areas clean and dry. Clean the affected area gently with water only. Pat your skin dry. Avoid using rough towels or toilet paper. Avoid skin products that irritate the skin. These include scented soaps, lotions, and bubble bath. Use thick cream or ointments to lessen itching as told. General instructions Your condition may cause trouble pooping. To help prevent or treat this, you may need to: Take medicines to help you poop. Eat foods high in fiber, like beans, whole grains, and fresh fruits and vegetables. Drink more fluids as told. Keep all follow-up visits to make sure the treatment plan is working. Contact a health care provider if: You have more redness, swelling, or pain in the affected area. You have fluid, blood, or pus coming from the affected area. You have new patches on your skin. You have a fever. You have pain during sex. This information is not intended to replace advice given to you by your health care provider. Make sure you discuss any questions you have with your health care provider. Document Revised: 09/22/2022 Document Reviewed: 09/22/2022 Elsevier Patient Education  2024 Elsevier Inc.  EXERCISE AND DIET:  We recommended that you start or continue a  regular exercise program for good health. Regular exercise means any activity that makes your heart beat faster and makes you sweat.  We recommend exercising at least 30 minutes per day at least 3 days a week, preferably 4 or 5.  We also recommend a diet low in fat and sugar.  Inactivity, poor dietary choices and obesity can cause diabetes, heart attack, stroke, and kidney damage, among others.    ALCOHOL AND SMOKING:  Women should limit their alcohol intake to no more than 7 drinks/beers/glasses of wine (combined, not each!) per week. Moderation of alcohol intake to this level decreases your risk of breast cancer and liver damage. And of course, no recreational drugs are part of a healthy lifestyle.  And absolutely no smoking or even second hand smoke. Most people know smoking can cause heart and lung diseases, but did you know it also contributes to weakening of your bones? Aging of your skin?  Yellowing of your teeth and nails?  CALCIUM AND VITAMIN D :  Adequate intake of calcium and Vitamin D  are recommended.  The recommendations for exact amounts of these supplements seem to change often, but generally speaking 600 mg of calcium (either carbonate or citrate) and 800 units of Vitamin D  per day seems prudent. Certain women may benefit from higher intake of Vitamin D .  If you are among  these women, your doctor will have told you during your visit.    PAP SMEARS:  Pap smears, to check for cervical cancer or precancers,  have traditionally been done yearly, although recent scientific advances have shown that most women can have pap smears less often.  However, every woman still should have a physical exam from her gynecologist every year. It will include a breast check, inspection of the vulva and vagina to check for abnormal growths or skin changes, a visual exam of the cervix, and then an exam to evaluate the size and shape of the uterus and ovaries.  And after 74 years of age, a rectal exam is indicated to  check for rectal cancers. We will also provide age appropriate advice regarding health maintenance, like when you should have certain vaccines, screening for sexually transmitted diseases, bone density testing, colonoscopy, mammograms, etc.   MAMMOGRAMS:  All women over 48 years old should have a yearly mammogram. Many facilities now offer a 3D mammogram, which may cost around $50 extra out of pocket. If possible,  we recommend you accept the option to have the 3D mammogram performed.  It both reduces the number of women who will be called back for extra views which then turn out to be normal, and it is better than the routine mammogram at detecting truly abnormal areas.    COLONOSCOPY:  Colonoscopy to screen for colon cancer is recommended for all women at age 63.  We know, you hate the idea of the prep.  We agree, BUT, having colon cancer and not knowing it is worse!!  Colon cancer so often starts as a polyp that can be seen and removed at colonscopy, which can quite literally save your life!  And if your first colonoscopy is normal and you have no family history of colon cancer, most women don't have to have it again for 10 years.  Once every ten years, you can do something that may end up saving your life, right?  We will be happy to help you get it scheduled when you are ready.  Be sure to check your insurance coverage so you understand how much it will cost.  It may be covered as a preventative service at no cost, but you should check your particular policy.

## 2023-10-05 ENCOUNTER — Other Ambulatory Visit

## 2023-10-05 DIAGNOSIS — Z1329 Encounter for screening for other suspected endocrine disorder: Secondary | ICD-10-CM

## 2023-10-05 DIAGNOSIS — Z0000 Encounter for general adult medical examination without abnormal findings: Secondary | ICD-10-CM

## 2023-10-05 DIAGNOSIS — E7439 Other disorders of intestinal carbohydrate absorption: Secondary | ICD-10-CM

## 2023-10-05 DIAGNOSIS — Z13220 Encounter for screening for lipoid disorders: Secondary | ICD-10-CM

## 2023-10-05 LAB — CYTOLOGY - PAP
Comment: NEGATIVE
Diagnosis: NEGATIVE
High risk HPV: NEGATIVE

## 2023-10-06 ENCOUNTER — Ambulatory Visit: Payer: Self-pay | Admitting: Internal Medicine

## 2023-10-07 ENCOUNTER — Encounter: Payer: Self-pay | Admitting: Internal Medicine

## 2023-10-07 ENCOUNTER — Ambulatory Visit: Admitting: Internal Medicine

## 2023-10-07 ENCOUNTER — Other Ambulatory Visit: Payer: Self-pay

## 2023-10-07 VITALS — BP 130/80 | HR 72 | Ht 65.75 in | Wt 184.0 lb

## 2023-10-07 DIAGNOSIS — Z1231 Encounter for screening mammogram for malignant neoplasm of breast: Secondary | ICD-10-CM

## 2023-10-07 DIAGNOSIS — Z780 Asymptomatic menopausal state: Secondary | ICD-10-CM

## 2023-10-07 DIAGNOSIS — Z8719 Personal history of other diseases of the digestive system: Secondary | ICD-10-CM

## 2023-10-07 DIAGNOSIS — Z0000 Encounter for general adult medical examination without abnormal findings: Secondary | ICD-10-CM

## 2023-10-07 DIAGNOSIS — Z9049 Acquired absence of other specified parts of digestive tract: Secondary | ICD-10-CM

## 2023-10-07 DIAGNOSIS — Z8639 Personal history of other endocrine, nutritional and metabolic disease: Secondary | ICD-10-CM

## 2023-10-07 DIAGNOSIS — Z8781 Personal history of (healed) traumatic fracture: Secondary | ICD-10-CM

## 2023-10-07 DIAGNOSIS — R600 Localized edema: Secondary | ICD-10-CM | POA: Diagnosis not present

## 2023-10-07 DIAGNOSIS — M25561 Pain in right knee: Secondary | ICD-10-CM | POA: Diagnosis not present

## 2023-10-07 DIAGNOSIS — Z13820 Encounter for screening for osteoporosis: Secondary | ICD-10-CM

## 2023-10-07 DIAGNOSIS — Z9889 Other specified postprocedural states: Secondary | ICD-10-CM

## 2023-10-07 DIAGNOSIS — E2839 Other primary ovarian failure: Secondary | ICD-10-CM

## 2023-10-07 DIAGNOSIS — E559 Vitamin D deficiency, unspecified: Secondary | ICD-10-CM

## 2023-10-07 DIAGNOSIS — F439 Reaction to severe stress, unspecified: Secondary | ICD-10-CM | POA: Diagnosis not present

## 2023-10-07 DIAGNOSIS — E7439 Other disorders of intestinal carbohydrate absorption: Secondary | ICD-10-CM

## 2023-10-07 DIAGNOSIS — Z87898 Personal history of other specified conditions: Secondary | ICD-10-CM

## 2023-10-07 DIAGNOSIS — R718 Other abnormality of red blood cells: Secondary | ICD-10-CM

## 2023-10-07 DIAGNOSIS — M21371 Foot drop, right foot: Secondary | ICD-10-CM

## 2023-10-07 LAB — COMPLETE METABOLIC PANEL WITHOUT GFR
AG Ratio: 1.7 (calc) (ref 1.0–2.5)
ALT: 27 U/L (ref 6–29)
AST: 27 U/L (ref 10–35)
Albumin: 4.3 g/dL (ref 3.6–5.1)
Alkaline phosphatase (APISO): 77 U/L (ref 37–153)
BUN: 23 mg/dL (ref 7–25)
CO2: 30 mmol/L (ref 20–32)
Calcium: 10.3 mg/dL (ref 8.6–10.4)
Chloride: 101 mmol/L (ref 98–110)
Creat: 0.83 mg/dL (ref 0.60–1.00)
Globulin: 2.5 g/dL (ref 1.9–3.7)
Glucose, Bld: 103 mg/dL — ABNORMAL HIGH (ref 65–99)
Potassium: 4.1 mmol/L (ref 3.5–5.3)
Sodium: 140 mmol/L (ref 135–146)
Total Bilirubin: 0.5 mg/dL (ref 0.2–1.2)
Total Protein: 6.8 g/dL (ref 6.1–8.1)

## 2023-10-07 LAB — CBC WITH DIFFERENTIAL/PLATELET
Absolute Lymphocytes: 1314 {cells}/uL (ref 850–3900)
Absolute Monocytes: 646 {cells}/uL (ref 200–950)
Basophils Absolute: 43 {cells}/uL (ref 0–200)
Basophils Relative: 0.6 %
Eosinophils Absolute: 263 {cells}/uL (ref 15–500)
Eosinophils Relative: 3.7 %
HCT: 42.8 % (ref 35.0–45.0)
Hemoglobin: 13.8 g/dL (ref 11.7–15.5)
MCH: 32.9 pg (ref 27.0–33.0)
MCHC: 32.2 g/dL (ref 32.0–36.0)
MCV: 101.9 fL — ABNORMAL HIGH (ref 80.0–100.0)
MPV: 11.7 fL (ref 7.5–12.5)
Monocytes Relative: 9.1 %
Neutro Abs: 4835 {cells}/uL (ref 1500–7800)
Neutrophils Relative %: 68.1 %
Platelets: 272 Thousand/uL (ref 140–400)
RBC: 4.2 Million/uL (ref 3.80–5.10)
RDW: 11.6 % (ref 11.0–15.0)
Total Lymphocyte: 18.5 %
WBC: 7.1 Thousand/uL (ref 3.8–10.8)

## 2023-10-07 LAB — POCT URINALYSIS DIP (CLINITEK)
Bilirubin, UA: NEGATIVE
Blood, UA: NEGATIVE
Glucose, UA: NEGATIVE mg/dL
Ketones, POC UA: NEGATIVE mg/dL
Leukocytes, UA: NEGATIVE
Nitrite, UA: NEGATIVE
POC PROTEIN,UA: NEGATIVE
Spec Grav, UA: 1.01 (ref 1.010–1.025)
Urobilinogen, UA: 0.2 U/dL
pH, UA: 6.5 (ref 5.0–8.0)

## 2023-10-07 LAB — HEMOGLOBIN A1C
Hgb A1c MFr Bld: 5.7 % — ABNORMAL HIGH (ref <5.7)
Mean Plasma Glucose: 117 mg/dL
eAG (mmol/L): 6.5 mmol/L

## 2023-10-07 LAB — TEST AUTHORIZATION

## 2023-10-07 LAB — LIPID PANEL
Cholesterol: 185 mg/dL (ref <200)
HDL: 107 mg/dL (ref >=50)
LDL Cholesterol (Calc): 64 mg/dL
Non-HDL Cholesterol (Calc): 78 mg/dL (ref <130)
Total CHOL/HDL Ratio: 1.7 (calc) (ref <5.0)
Triglycerides: 61 mg/dL (ref <150)

## 2023-10-07 LAB — FOLATE: Folate: 15.6 ng/mL

## 2023-10-07 LAB — VITAMIN B12: Vitamin B-12: 393 pg/mL (ref 200–1100)

## 2023-10-07 LAB — TSH: TSH: 2.27 m[IU]/L (ref 0.40–4.50)

## 2023-10-07 NOTE — Progress Notes (Signed)
 Subjective:   Beverly [name] is a 73 y.o. female who presents for an Initial Medicare Annual Wellness Visit.  Visit Complete: In person  Patient Medicare AWV questionnaire was completed by the patient on 10/07/2023; I have confirmed that all information answered by patient is correct and no changes since this date.  Cardiac Risk Factors include: advanced age (>33men, >1 women)     Objective:    Today's Vitals   10/07/23 1425 10/07/23 1426  BP: 130/80   Pulse: 72   SpO2: 98%   Weight: 184 lb (83.5 kg)   Height: 5' 5.75 (1.67 m)   PainSc: 0-No pain 0-No pain   Body mass index is 29.92 kg/m.     10/07/2023    1:59 PM 09/27/2023   10:52 AM 06/14/2023    1:53 PM 06/05/2014   11:40 AM 06/01/2014    3:26 PM  Advanced Directives  Does Patient Have a Medical Advance Directive? Yes Yes Yes No  No   Type of Advance Directive Living will;Healthcare Power of [employer] Power of [place];Living will Healthcare Power of [place];Living will    Copy of Healthcare Power of Attorney in Chart? No - copy requested      Would patient like information on creating a medical advance directive?    No - patient declined information  No - patient declined information      Data saved with a previous flowsheet row definition    Current Medications (verified) Outpatient Encounter Medications as of 10/07/2023  Medication Sig   betamethasone  valerate ointment (VALISONE ) 0.1 % Apply 1 Application topically 2 (two) times daily. Use twice daily to affected area for 2 weeks for a flare.  Then use twice weekly at bedtime for maintenance dosing.   furosemide  (LASIX ) 40 MG tablet Take 1 tablet (40 mg total) by mouth daily.   Vitamin D , Ergocalciferol , (DRISDOL ) 1.25 MG (50000 UNIT) CAPS capsule TAKE 1 CAPSULE BY MOUTH 1 TIME A WEEK   No facility-administered encounter medications on file as of 10/07/2023.    Allergies (verified) Flagyl [metronidazole] and Adhesive [tape]   History: Past  Medical History:  Diagnosis Date   Family history of adverse reaction to anesthesia    pt's mother has hx. of post-op nausea   History of Crohn's disease    Ileostomy in place [facility])    Lichen sclerosus    Osteopenia    Past Surgical History:  Procedure Laterality Date   BREAST REDUCTION SURGERY  2011   ORIF ANKLE FRACTURE Right 06/08/2014   Procedure: OPEN REDUCTION INTERNAL FIXATION (ORIF) RIGHT BIMALLEOLAR ANKLE FRACTURE;  Surgeon: Evalene JONETTA Chancy, MD;  Location: [facility];  Service: Orthopedics;  Laterality: Right;   REDUCTION MAMMAPLASTY Bilateral    STRABISMUS SURGERY     X 2   TOTAL COLECTOMY  2004   TUBAL LIGATION     Family History  Problem Relation Age of Onset   Uterine cancer Mother 2   Colon cancer Mother 66   Hypertension Mother    Cancer Mother    Cancer Father        prostate cancer which spread to the bones   Prostate cancer Father    Fibromyalgia Sister    Heart Problems Sister        [place]   Diabetes Sister    Heart attack Sister        heart attack at athe age of 35   Social History   Socioeconomic History  Marital status: Unknown    Spouse name: Not on file   Number of children: Not on file   Years of education: Not on file   Highest education level: Professional school degree (e.g., MD, DDS, DVM, JD)  Occupational History   Not on file  Tobacco Use   Smoking status: Former    Current packs/day: 0.00    Types: Cigarettes    Quit date: 11/08/2000    Years since quitting: 22.9   Smokeless tobacco: Never  Substance and Sexual Activity   Alcohol use: Yes    Comment: 1-2 glasses wine daily   Drug use: No   Sexual activity: Not Currently    Comment: No intercourse before 15, Not more than 5 partners,no abdnormal paps, No STIs, No DES,no HX BC  Other Topics Concern   Not on file  Social History Narrative   Are you right handed or left handed? Right Handed   Are you currently employed ? No    What is your current  occupation? Retired 10/01/2022   Do you live at home alone? Yes   Who lives with you?    What type of home do you live in: 1 story or 2 story? Lives in a one story home with a bonus room on top floor        Social Drivers of Health   Financial Resource Strain: Low Risk  (10/07/2023)   Overall Financial Resource Strain (CARDIA)    Difficulty of Paying Living Expenses: Not hard at all  Food Insecurity: No Food Insecurity (10/07/2023)   Hunger Vital Sign    Worried About Running Out of Food in the Last Year: Never true    Ran Out of Food in the Last Year: Never true  Transportation Needs: No Transportation Needs (10/07/2023)   PRAPARE - Administrator, Civil Service (Medical): No    Lack of Transportation (Non-Medical): No  Physical Activity: Insufficiently Active (10/07/2023)   Exercise Vital Sign    Days of Exercise per Week: 2 days    Minutes of Exercise per Session: 40 min  Stress: No Stress Concern Present (10/07/2023)   [employer] of Occupational Health - Occupational Stress Questionnaire    Feeling of Stress: Only a little  Social Connections: Unknown (10/07/2023)   Social Connection and Isolation Panel    Frequency of Communication with Friends and Family: More than three times a week    Frequency of Social Gatherings with Friends and Family: Twice a week    Attends Religious Services: Patient declined    Database administrator or Organizations: Yes    Attends Engineer, structural: More than 4 times per year    Marital Status: Patient declined    Tobacco Counseling Counseling given: No   Clinical Intake:  Pre-visit preparation completed: Yes  Pain : No/denies pain Pain Score: 0-No pain     BMI - recorded: 29.92 Nutritional Status: BMI 25 -29 Overweight Diabetes: No  How often do you need to have someone help you when you read instructions, pamphlets, or other written materials from your doctor or pharmacy?: 1 - Never  Interpreter Needed?:  No  Information entered by :: Kathlynn Porto, CMA   Activities of Daily Living    10/07/2023    1:58 PM 10/04/2023   11:38 AM  In your present state of health, do you have any difficulty performing the following activities:  Hearing? 0 0  Vision? 0 0  Difficulty concentrating or making  decisions? 0 0  Walking or climbing stairs? 1 1  Dressing or bathing? 0 0  Doing errands, shopping? 0 0  Preparing Food and eating ? N N  Using the Toilet? N N  In the past six months, have you accidently leaked urine? Y Y  Do you have problems with loss of bowel control? N N  Managing your Medications? N N  Managing your Finances? N N  Housekeeping or managing your Housekeeping? N N    Patient Care Team: Perri Ronal PARAS, MD as PCP - General (Internal Medicine) [name], [name], DO as Consulting Physician (Neurology)  Indicate any recent Medical Services you may have received from other than [facility] providers in the past year (date may be approximate).     Assessment:   This is a routine wellness examination for [place].  Hearing/Vision screen Hearing Screening   500Hz  2000Hz  3000Hz  4000Hz   Right ear Pass Pass Pass Pass  Left ear Pass Pass Pass Pass   Vision Screening   Right eye Left eye Both eyes  Without correction     With correction 20/20 20/20 20/20      Goals Addressed   None    Depression Screen    10/07/2023    1:59 PM 06/11/2023    2:28 PM 05/28/2023    2:33 PM 03/30/2022    4:28 PM 08/19/2021   10:58 AM 03/25/2020    3:08 PM 02/21/2019    2:12 PM  PHQ 2/9 Scores  PHQ - 2 Score 0 0 0 0 0 0 0    Fall Risk    10/07/2023    2:28 PM 10/07/2023    1:59 PM 10/04/2023   11:38 AM 10/01/2023    8:34 AM 09/27/2023   10:51 AM  Fall Risk   Falls in the past year? 0  0 0 0  Number falls in past yr: 0 0 0 0 0  Injury with Fall? 0 0 0 0 0  Risk for fall due to : No Fall Risks No Fall Risks  No Fall Risks   Follow up Falls prevention discussed;Education provided;Falls evaluation  completed Education provided;Falls prevention discussed;Falls evaluation completed  Falls evaluation completed Falls evaluation completed    MEDICARE RISK AT HOME: Medicare Risk at Home Any stairs in or around the home?: Yes If so, are there any without handrails?: No Home free of loose throw rugs in walkways, pet beds, electrical cords, etc?: Yes Adequate lighting in your home to reduce risk of falls?: Yes Life alert?: (Patient-Rptd) No Use of a cane, walker or w/c?: No Grab bars in the bathroom?: Yes Shower chair or bench in shower?: No Elevated toilet seat or a handicapped toilet?: Yes  TIMED UP AND GO:  Was the test performed? No    Cognitive Function:        10/07/2023    1:59 PM  6CIT Screen  What Year? 0 points  What month? 0 points  What time? 0 points  Count back from 20 0 points  Months in reverse 0 points  Repeat phrase 0 points  Total Score 0 points    Immunizations Immunization History  Administered Date(s) Administered   Fluad  Trivalent(High Dose 65+) 01/05/2023   Influenza, High Dose Seasonal PF 01/17/2019   Influenza,inj,Quad PF,6+ Mos 01/17/2019, 12/18/2021   Influenza-Unspecified 12/13/2013, 12/15/2016, 12/15/2017, 12/12/2019   PFIZER Comirnaty (Gray Top)Covid-19 Tri-Sucrose Vaccine 09/13/2020   PFIZER(Purple Top)SARS-COV-2 Vaccination 04/30/2019, 05/31/2019, 01/13/2020   Pfizer Covid-19 Vaccine Bivalent Booster 31yrs & up 01/06/2021  Pfizer(Comirnaty )Fall Seasonal Vaccine 12 years and older 02/09/2022, 01/05/2023   Pneumococcal Conjugate-13 12/19/2014   Pneumococcal Polysaccharide-23 02/21/2019   Tdap 04/16/2005, 09/07/2022   Zoster, Live 02/07/2012    TDAP status: Up to date  Flu Vaccine status: Due, Education has been provided regarding the importance of this vaccine. Advised may receive this vaccine at local pharmacy or Health Dept. Aware to provide a copy of the vaccination record if obtained from local pharmacy or Health Dept. Verbalized  acceptance and understanding.  Pneumococcal vaccine status: Up to date  Covid-19 vaccine status: Information provided on how to obtain vaccines.   Qualifies for Shingles Vaccine? Yes   Zostavax completed Yes   Shingrix Completed?: No.    Education has been provided regarding the importance of this vaccine. Patient has been advised to call insurance company to determine out of pocket expense if they have not yet received this vaccine. Advised may also receive vaccine at local pharmacy or Health Dept. Verbalized acceptance and understanding.  Screening Tests Health Maintenance  Topic Date Due   INFLUENZA VACCINE  10/01/2023   Zoster Vaccines- Shingrix (1 of 2) 10/07/2023 (Originally 04/12/1999)   MAMMOGRAM  09/20/2024   Medicare Annual Wellness (AWV)  10/06/2024   DTaP/Tdap/Td (3 - Td or Tdap) 09/06/2032   Pneumococcal Vaccine: 50+ Years  Completed   DEXA SCAN  Completed   Hepatitis B Vaccines  Aged Out   HPV VACCINES  Aged Out   Meningococcal B Vaccine  Aged Out   Colonoscopy  Discontinued   COVID-19 Vaccine  Discontinued   Hepatitis C Screening  Discontinued    Health Maintenance  Health Maintenance Due  Topic Date Due   INFLUENZA VACCINE  10/01/2023    Colorectal cancer screening: No longer required.   Mammogram status: Ordered 10/07/2023. Pt provided with contact info and advised to call to schedule appt.   Bone Density status: Ordered 10/07/2023. Pt provided with contact info and advised to call to schedule appt.  Lung Cancer Screening: (Low Dose CT Chest recommended if Age 85-80 years, 20 pack-year currently smoking OR have quit w/in 15years.) does not qualify.    Additional Screening:  Hepatitis C Screening: does not qualify;   Vision Screening: Recommended annual ophthalmology exams for early detection of glaucoma and other disorders of the eye. Is the patient up to date with their annual eye exam?  Yes  Who is the provider or what is the name of the office in  which the patient attends annual eye exams? [facility] If pt is not established with a provider, would they like to be referred to a provider to establish care? No .   Dental Screening: Recommended annual dental exams for proper oral hygiene   Community Resource Referral / Chronic Care Management: CRR required this visit?  No   CCM required this visit?  No     Plan:     I have personally reviewed and noted the following in the patient's chart:   Medical and social history Use of alcohol, tobacco or illicit drugs  Current medications and supplements including opioid prescriptions. Patient is not currently taking opioid prescriptions. Functional ability and status Nutritional status Physical activity Advanced directives List of other physicians Hospitalizations, surgeries, and ER visits in previous 12 months Vitals Screenings to include cognitive, depression, and falls Referrals and appointments  In addition, I have reviewed and discussed with patient certain preventive protocols, quality metrics, and best practice recommendations. A written personalized care plan for preventive services as well  as general preventive health recommendations were provided to patient.     Haseeb Fiallos Zelda, CMA   10/07/2023   After Visit Summary: (In Person-Printed) AVS printed and given to the patient I have reviewed and agree with the above Annual Wellness Visit documentation.  Ronal Norleen Hailstone, MD. Internal Medicine 10/07/2023

## 2023-10-07 NOTE — Progress Notes (Deleted)
 Subjective:   Beverly [name] is a 73 y.o. female who presents for Medicare Annual (Subsequent) preventive examination.  Visit Complete: In person  Patient Medicare AWV questionnaire was completed by the patient on 10/07/2023; I have confirmed that all information answered by patient is correct and no changes since this date.  Cardiac Risk Factors include: advanced age (>29men, >28 women)     Objective:    Today's Vitals   10/07/23 1425 10/07/23 1426  BP: 130/80   Pulse: 72   SpO2: 98%   Weight: 184 lb (83.5 kg)   Height: 5' 5.75 (1.67 m)   PainSc: 0-No pain 0-No pain   Body mass index is 29.92 kg/m.     10/07/2023    1:59 PM 09/27/2023   10:52 AM 06/14/2023    1:53 PM 06/05/2014   11:40 AM 06/01/2014    3:26 PM  Advanced Directives  Does Patient Have a Medical Advance Directive? Yes Yes Yes No  No   Type of Advance Directive Living will;Healthcare Power of [employer] Power of [place];Living will Healthcare Power of [place];Living will    Copy of Healthcare Power of Attorney in Chart? No - copy requested      Would patient like information on creating a medical advance directive?    No - patient declined information  No - patient declined information      Data saved with a previous flowsheet row definition    Current Medications (verified) Outpatient Encounter Medications as of 10/07/2023  Medication Sig   betamethasone  valerate ointment (VALISONE ) 0.1 % Apply 1 Application topically 2 (two) times daily. Use twice daily to affected area for 2 weeks for a flare.  Then use twice weekly at bedtime for maintenance dosing.   furosemide  (LASIX ) 40 MG tablet Take 1 tablet (40 mg total) by mouth daily.   Vitamin D , Ergocalciferol , (DRISDOL ) 1.25 MG (50000 UNIT) CAPS capsule TAKE 1 CAPSULE BY MOUTH 1 TIME A WEEK   No facility-administered encounter medications on file as of 10/07/2023.    Allergies (verified) Flagyl [metronidazole] and Adhesive [tape]    History: Past Medical History:  Diagnosis Date   Family history of adverse reaction to anesthesia    pt's mother has hx. of post-op nausea   History of Crohn's disease    Ileostomy in place [facility])    Lichen sclerosus    Osteopenia    Past Surgical History:  Procedure Laterality Date   BREAST REDUCTION SURGERY  2011   ORIF ANKLE FRACTURE Right 06/08/2014   Procedure: OPEN REDUCTION INTERNAL FIXATION (ORIF) RIGHT BIMALLEOLAR ANKLE FRACTURE;  Surgeon: Evalene JONETTA Chancy, MD;  Location: [facility];  Service: Orthopedics;  Laterality: Right;   REDUCTION MAMMAPLASTY Bilateral    STRABISMUS SURGERY     X 2   TOTAL COLECTOMY  2004   TUBAL LIGATION     Family History  Problem Relation Age of Onset   Uterine cancer Mother 46   Colon cancer Mother 48   Hypertension Mother    Cancer Mother    Cancer Father        prostate cancer which spread to the bones   Prostate cancer Father    Fibromyalgia Sister    Heart Problems Sister        [place]   Diabetes Sister    Heart attack Sister        heart attack at athe age of 39   Social History   Socioeconomic History  Marital status: Unknown    Spouse name: Not on file   Number of children: Not on file   Years of education: Not on file   Highest education level: Professional school degree (e.g., MD, DDS, DVM, JD)  Occupational History   Not on file  Tobacco Use   Smoking status: Former    Current packs/day: 0.00    Types: Cigarettes    Quit date: 11/08/2000    Years since quitting: 22.9   Smokeless tobacco: Never  Substance and Sexual Activity   Alcohol use: Yes    Comment: 1-2 glasses wine daily   Drug use: No   Sexual activity: Not Currently    Comment: No intercourse before 15, Not more than 5 partners,no abdnormal paps, No STIs, No DES,no HX BC  Other Topics Concern   Not on file  Social History Narrative   Are you right handed or left handed? Right Handed   Are you currently employed ? No    What  is your current occupation? Retired 10/01/2022   Do you live at home alone? Yes   Who lives with you?    What type of home do you live in: 1 story or 2 story? Lives in a one story home with a bonus room on top floor        Social Drivers of Health   Financial Resource Strain: Low Risk  (10/07/2023)   Overall Financial Resource Strain (CARDIA)    Difficulty of Paying Living Expenses: Not hard at all  Food Insecurity: No Food Insecurity (10/07/2023)   Hunger Vital Sign    Worried About Running Out of Food in the Last Year: Never true    Ran Out of Food in the Last Year: Never true  Transportation Needs: No Transportation Needs (10/07/2023)   PRAPARE - Administrator, Civil Service (Medical): No    Lack of Transportation (Non-Medical): No  Physical Activity: Insufficiently Active (10/07/2023)   Exercise Vital Sign    Days of Exercise per Week: 2 days    Minutes of Exercise per Session: 40 min  Stress: No Stress Concern Present (10/07/2023)   [employer] of Occupational Health - Occupational Stress Questionnaire    Feeling of Stress: Only a little  Social Connections: Unknown (10/07/2023)   Social Connection and Isolation Panel    Frequency of Communication with Friends and Family: More than three times a week    Frequency of Social Gatherings with Friends and Family: Twice a week    Attends Religious Services: Patient declined    Database administrator or Organizations: Yes    Attends Engineer, structural: More than 4 times per year    Marital Status: Patient declined    Tobacco Counseling Counseling given: No   Clinical Intake:  Pre-visit preparation completed: Yes  Pain : No/denies pain Pain Score: 0-No pain     BMI - recorded: 29.92 Nutritional Status: BMI 25 -29 Overweight Diabetes: No  How often do you need to have someone help you when you read instructions, pamphlets, or other written materials from your doctor or pharmacy?: 1 -  Never  Interpreter Needed?: No  Information entered by :: Beverly [name], CMA   Activities of Daily Living    10/07/2023    1:58 PM 10/04/2023   11:38 AM  In your present state of health, do you have any difficulty performing the following activities:  Hearing? 0 0  Vision? 0 0  Difficulty concentrating or making  decisions? 0 0  Walking or climbing stairs? 1 1  Dressing or bathing? 0 0  Doing errands, shopping? 0 0  Preparing Food and eating ? N N  Using the Toilet? N N  In the past six months, have you accidently leaked urine? Y Y  Do you have problems with loss of bowel control? N N  Managing your Medications? N N  Managing your Finances? N N  Housekeeping or managing your Housekeeping? N N    Patient Care Team: Perri Ronal PARAS, MD as PCP - General (Internal Medicine) [name], [name], DO as Consulting Physician (Neurology)  Indicate any recent Medical Services you may have received from other than [facility] providers in the past year (date may be approximate).     Assessment:   This is a routine wellness examination for Beverly [name].  Hearing/Vision screen Hearing Screening   500Hz  2000Hz  3000Hz  4000Hz   Right ear Pass Pass Pass Pass  Left ear Pass Pass Pass Pass   Vision Screening   Right eye Left eye Both eyes  Without correction     With correction 20/20 20/20 20/20      Goals Addressed   None    Depression Screen    10/07/2023    1:59 PM 06/11/2023    2:28 PM 05/28/2023    2:33 PM 03/30/2022    4:28 PM 08/19/2021   10:58 AM 03/25/2020    3:08 PM 02/21/2019    2:12 PM  PHQ 2/9 Scores  PHQ - 2 Score 0 0 0 0 0 0 0    Fall Risk    10/07/2023    2:28 PM 10/07/2023    1:59 PM 10/04/2023   11:38 AM 10/01/2023    8:34 AM 09/27/2023   10:51 AM  Fall Risk   Falls in the past year? 0  0 0 0  Number falls in past yr: 0 0 0 0 0  Injury with Fall? 0 0 0 0 0  Risk for fall due to : No Fall Risks No Fall Risks  No Fall Risks   Follow up Falls prevention discussed;Education  provided;Falls evaluation completed Education provided;Falls prevention discussed;Falls evaluation completed  Falls evaluation completed Falls evaluation completed    MEDICARE RISK AT HOME: Medicare Risk at Home Any stairs in or around the home?: Yes If so, are there any without handrails?: No Home free of loose throw rugs in walkways, pet beds, electrical cords, etc?: Yes Adequate lighting in your home to reduce risk of falls?: Yes Life alert?: (Patient-Rptd) No Use of a cane, walker or w/c?: No Grab bars in the bathroom?: Yes Shower chair or bench in shower?: No Elevated toilet seat or a handicapped toilet?: Yes  TIMED UP AND GO:  Was the test performed?  No    Cognitive Function:        10/07/2023    1:59 PM  6CIT Screen  What Year? 0 points  What month? 0 points  What time? 0 points  Count back from 20 0 points  Months in reverse 0 points  Repeat phrase 0 points  Total Score 0 points    Immunizations Immunization History  Administered Date(s) Administered   Fluad  Trivalent(High Dose 65+) 01/05/2023   Influenza, High Dose Seasonal PF 01/17/2019   Influenza,inj,Quad PF,6+ Mos 01/17/2019, 12/18/2021   Influenza-Unspecified 12/13/2013, 12/15/2016, 12/15/2017, 12/12/2019   PFIZER Comirnaty ETTERGray Top)Covid-19 Tri-Sucrose Vaccine 09/13/2020   PFIZER(Purple Top)SARS-COV-2 Vaccination 04/30/2019, 05/31/2019, 01/13/2020   Pfizer Covid-19 Vaccine Bivalent Booster 36yrs & up 01/06/2021  Pfizer(Comirnaty )Fall Seasonal Vaccine 12 years and older 02/09/2022, 01/05/2023   Pneumococcal Conjugate-13 12/19/2014   Pneumococcal Polysaccharide-23 02/21/2019   Tdap 04/16/2005, 09/07/2022   Zoster, Live 02/07/2012    TDAP status: Up to date  Flu Vaccine status: Due, Education has been provided regarding the importance of this vaccine. Advised may receive this vaccine at local pharmacy or Health Dept. Aware to provide a copy of the vaccination record if obtained from local pharmacy or  Health Dept. Verbalized acceptance and understanding.  Pneumococcal vaccine status: Up to date  Covid-19 vaccine status: Information provided on how to obtain vaccines.   Qualifies for Shingles Vaccine? Yes   Zostavax completed Yes   Shingrix Completed?: No.    Education has been provided regarding the importance of this vaccine. Patient has been advised to call insurance company to determine out of pocket expense if they have not yet received this vaccine. Advised may also receive vaccine at local pharmacy or Health Dept. Verbalized acceptance and understanding.  Screening Tests Health Maintenance  Topic Date Due   Hepatitis C Screening  Never done   INFLUENZA VACCINE  10/01/2023   Zoster Vaccines- Shingrix (1 of 2) 10/07/2023 (Originally 04/12/1999)   MAMMOGRAM  09/20/2024   Medicare Annual Wellness (AWV)  10/06/2024   DTaP/Tdap/Td (3 - Td or Tdap) 09/06/2032   Pneumococcal Vaccine: 50+ Years  Completed   DEXA SCAN  Completed   Hepatitis B Vaccines  Aged Out   HPV VACCINES  Aged Out   Meningococcal B Vaccine  Aged Out   Colonoscopy  Discontinued   COVID-19 Vaccine  Discontinued    Health Maintenance  Health Maintenance Due  Topic Date Due   Hepatitis C Screening  Never done   INFLUENZA VACCINE  10/01/2023    Colorectal cancer screening: No longer required.   Mammogram status: Ordered 10/07/2023. Pt provided with contact info and advised to call to schedule appt.   Bone Density status: Ordered 10/07/2023. Pt provided with contact info and advised to call to schedule appt.  Lung Cancer Screening: (Low Dose CT Chest recommended if Age 84-80 years, 20 pack-year currently smoking OR have quit w/in 15years.) does not qualify.    Additional Screening:  Hepatitis C Screening: does not qualify;   Vision Screening: Recommended annual ophthalmology exams for early detection of glaucoma and other disorders of the eye. Is the patient up to date with their annual eye exam?  Yes   Who is the provider or what is the name of the office in which the patient attends annual eye exams? [facility] If pt is not established with a provider, would they like to be referred to a provider to establish care? No .   Dental Screening: Recommended annual dental exams for proper oral hygiene  Community Resource Referral / Chronic Care Management: CRR required this visit?  No   CCM required this visit?  No     Plan:     I have personally reviewed and noted the following in the patient's chart:   Medical and social history Use of alcohol, tobacco or illicit drugs  Current medications and supplements including opioid prescriptions. Patient is not currently taking opioid prescriptions. Functional ability and status Nutritional status Physical activity Advanced directives List of other physicians Hospitalizations, surgeries, and ER visits in previous 12 months Vitals Screenings to include cognitive, depression, and falls Referrals and appointments  In addition, I have reviewed and discussed with patient certain preventive protocols, quality metrics, and best practice recommendations. A written  personalized care plan for preventive services as well as general preventive health recommendations were provided to patient.     Beverly [name], CMA   10/07/2023   After Visit Summary: (In Person-Printed) AVS printed and given to the patient

## 2023-10-07 NOTE — Progress Notes (Signed)
 Annual Wellness Visit   Patient Care Team: Bailee Thall, Ronal PARAS, MD as PCP - General (Internal Medicine) Tobie Tonita POUR, DO as Consulting Physician (Neurology)  Visit Date: 10/07/23   Chief Complaint  Patient presents with   Welcome to Medicare   Annual Exam   Subjective:  Patient: Beverly [name], Female DOB: October 13, 1950, 73 y.o. MRN: [id number] Beverly [name] is a 73 y.o. Female who presents today for her Welcome to Medicare. Patient has S/p Bilateral Breast Reduction; Lichen Sclerosus; and History of Vitamin-D Deficiency.  Discussed her recent Situational Stress regarding her youngest son who has moved back in with her after he discovered that funding for research he'd been working on was halted. Along with him, he brought his two dogs, one of which is a puppy.   History of Vitamin-D Deficiency treated with 50,000 units weekly. 08/31/2022 Vitamin-D: 47.   History of Dependent Edema treated with Lasix  40 mg daily. Blood Pressure: normotensive today at 130/80. 10/05/2023 Potassium WNL at 4.1.  Since April 2025, her glucose has remained elevated above 100, though has not been higher than 111 and her HgbA1c is barely elevated at 5.7, which is the highest it's ever risen to.  History of Right Knee Pain in January of 2024, she began to have Right Knee Pain that eventually resolved in May, however she then began having pain in her upper right leg. According to notes from [facility], she did have a transforaminal injection 09/25/2022. Additionally, at the time did have lingering weakness in right knee and numbness/tingling in right upper leg associated w/ slight tingling in the lower leg. She has also had mild pain in her right lower back for which she was seen at [facility]. For her pain, she tried Gabapentin and Meloxicam , which did not provide any relief, and similar results with Celebrex 200 mg daily. Hx right foot drop seen at [facility].  Labs 10/05/2023 CBC w/  Differential, compared to 7/01/224: MCV 101.9, elevated from 98.6; otherwise WNL Comprehensive Metabolic Panel, compared to 08/31/22: Glucose 103, elevated from 93; otherwise WNL Lipid Panel: WNL HgbA1c, compared to 07/2021: 5.7, elevated from 5.5. TSH: 2.27  History of Lichen Sclerosus. Followed by Gynecologist, Dr. Nikki; PAP Smear 10/01/2023 normal. Bimanual deferred  S/p Breast Reduction 2011; Mammogram 09/21/2022 normal with repeat recommendation of 2025.  History of Crohn's Disease, dx'ed 1994 and initially was treated with Imuran, but she stopped circa 2012; s/p Colectomy & Ilectomy attachment. Followed by Dr. Rosalie, GI, who did a Sigmoidoscopy on her in 2018. Last seen there 03/01/2023.   Bone Density 09/03/2021  T-score at Right Femur Neck was -1.6, osteopenic. History of Osteopenia previously treated with Fosamax, but stopped 2008.  Vaccine Counseling: Due for Influenza, Shingrix 1st dose, and Hepatitis C Screening - Discussed, she plans to update Influenza this fall, she wasn't sure if she'd already had her Shingrix but will get this, and declines Hepatitis C Screening; UTD on PNA and Tdap.  Review of Systems  Constitutional:  Negative for chills, fever, malaise/fatigue and weight loss.  HENT:  Negative for hearing loss, sinus pain and sore throat.   Respiratory:  Negative for cough, hemoptysis and shortness of breath.   Cardiovascular:  Negative for chest pain, palpitations, leg swelling and PND.  Gastrointestinal:  Negative for abdominal pain, constipation, diarrhea, heartburn, nausea and vomiting.  Genitourinary:  Negative for dysuria, frequency and urgency.  Musculoskeletal:  Positive for joint pain (right knee). Negative for back pain, myalgias and neck pain.  Skin:  Negative for itching and rash.  Neurological:  Negative for dizziness, tingling, seizures and headaches.  Endo/Heme/Allergies:  Negative for polydipsia.  Psychiatric/Behavioral:  Negative for depression. The patient  is not nervous/anxious.    Objective:  Vitals: body mass index is 29.92 kg/m. Today's Vitals   10/07/23 1425 10/07/23 1426  BP: 130/80   Pulse: 72   SpO2: 98%   Weight: 184 lb (83.5 kg)   Height: 5' 5.75 (1.67 m)   PainSc: 0-No pain 0-No pain   Physical Exam Vitals and nursing note reviewed.  Constitutional:      General: She is not in acute distress.    Appearance: Normal appearance. She is not ill-appearing or toxic-appearing.  HENT:     Head: Normocephalic and atraumatic.     Right Ear: Hearing, tympanic membrane, ear canal and external ear normal.     Left Ear: Hearing, tympanic membrane, ear canal and external ear normal.     Mouth/Throat:     Pharynx: Oropharynx is clear.  Eyes:     Extraocular Movements: Extraocular movements intact.     Pupils: Pupils are equal, round, and reactive to light.  Neck:     Thyroid: No thyroid mass, thyromegaly or thyroid tenderness.     Vascular: No carotid bruit.  Cardiovascular:     Rate and Rhythm: Normal rate and regular rhythm. No extrasystoles are present.    Pulses:          Dorsalis pedis pulses are 2+ on the right side and 2+ on the left side.     Heart sounds: Normal heart sounds. No murmur heard.    No friction rub. No gallop.     Comments: EDEMA: pt informs she did not take Lasix  this morning. Not pitting.  Pulmonary:     Effort: Pulmonary effort is normal.     Breath sounds: Normal breath sounds. No decreased breath sounds, wheezing, rhonchi or rales.  Chest:     Chest wall: No mass.  Abdominal:     Palpations: Abdomen is soft. There is no hepatomegaly, splenomegaly or mass.     Tenderness: There is no abdominal tenderness.     Hernia: No hernia is present.  Genitourinary:    Comments: Bimanual deferred  Musculoskeletal:     Cervical back: Normal range of motion.     Right lower leg: Edema present.     Left lower leg: Edema present.  Lymphadenopathy:     Cervical: No cervical adenopathy.     Upper Body:      Right upper body: No supraclavicular adenopathy.     Left upper body: No supraclavicular adenopathy.  Skin:    General: Skin is warm and dry.  Neurological:     General: No focal deficit present.     Mental Status: She is alert and oriented to person, place, and time. Mental status is at baseline.     Sensory: Sensation is intact.     Motor: Motor function is intact. No weakness.     Deep Tendon Reflexes: Reflexes are normal and symmetric.  Psychiatric:        Attention and Perception: Attention normal.        Mood and Affect: Mood normal.        Speech: Speech normal.        Behavior: Behavior normal.        Thought Content: Thought content normal.        Cognition and Memory: Cognition normal.  Judgment: Judgment normal.    Current Outpatient Medications  Medication Instructions   betamethasone  valerate ointment (VALISONE ) 0.1 % 1 Application, Topical, 2 times daily, Use twice daily to affected area for 2 weeks for a flare.  Then use twice weekly at bedtime for maintenance dosing.   furosemide  (LASIX ) 40 mg, Oral, Daily   Vitamin D , Ergocalciferol , (DRISDOL ) 1.25 MG (50000 UNIT) CAPS capsule TAKE 1 CAPSULE BY MOUTH 1 TIME A WEEK   Past Medical History:  Diagnosis Date   Family history of adverse reaction to anesthesia    pt's mother has hx. of post-op nausea   History of Crohn's disease    Ileostomy in place [facility])    Lichen sclerosus    Osteopenia    Medical/Surgical History Narrative:  Allergic/Intolerant to:  Allergies  Allergen Reactions   Flagyl [Metronidazole] Other (See Comments)    PERIPHERAL NEUROPATHY   Adhesive [Tape] Itching and Other (See Comments)    SKIN REDNESS   2016 - Bimalleolar Right Ankle Fx requiring reduction in ED due to moderate displacement. Subsequently was seen and treated at [facility].   2008 - Stress Fracture, Right Foot  1999 - Strabismus Surgery  1994 - Bilateral Tubal Ligation  Past Surgical History:   Procedure Laterality Date   BREAST REDUCTION SURGERY  2011   ORIF ANKLE FRACTURE Right 06/08/2014   Procedure: OPEN REDUCTION INTERNAL FIXATION (ORIF) RIGHT BIMALLEOLAR ANKLE FRACTURE;  Surgeon: Evalene JONETTA Chancy, MD;  Location:  SURGERY CENTER;  Service: Orthopedics;  Laterality: Right;   REDUCTION MAMMAPLASTY Bilateral    STRABISMUS SURGERY     X 2   TOTAL COLECTOMY  2004   TUBAL LIGATION    S/P Family History  Problem Relation Age of Onset   Uterine cancer Mother 65   Colon cancer Mother 64   Hypertension Mother    Cancer Mother    Cancer Father        prostate cancer which spread to the bones   Prostate cancer Father    Fibromyalgia Sister    Heart Problems Sister        Quadrupal Bypass   Diabetes Sister    Heart attack Sister        heart attack at athe age of 16   Family History Narrative: Father, deceased age 12 due to Prostate Cancer that also metastasized to his bones Mother, deceased age 63 presumably due to Congestive Heart Failure, w/ hx of Hypertension, Hypothyroidism, Endometrial/Uterine Cancer onset age 60, and Colon Cancer onset age 53 Siblings, 1 Sister w/ hx of Fibromyalgia, Pre-Diabetes, Heart Attack at age 98, and CABGx4 at age 42 Children, 2 Sons healthy as far as is known Social History   Social History Narrative   Are you right handed or left handed? Right Handed   Are you currently employed ? No    What is your current occupation? Retired 10/01/2022   Do you live at home alone? Yes   Who lives with you?    What type of home do you live in: 1 story or 2 story? Lives in a one story home with a bonus room on top floor    Has a Masters and JD degree. 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 Originally from [place] , and has previously lived in [place]. Divorced. For exercise, she goes to [employer] 2-3x/ week. Former smoker, quit in 2002, and has stopped drinking as well. 2 adult sons, youngest is 8 y/o.   Most Recent Health Risks  Assessment:    Medicare Risk at Home - 10/07/23 1358     Any stairs in or around the home? Yes    If so, are there any without handrails? No    Home free of loose throw rugs in walkways, pet beds, electrical cords, etc? Yes    Adequate lighting in your home to reduce risk of falls? Yes    Use of a cane, walker or w/c? No    Grab bars in the bathroom? Yes    Shower chair or bench in shower? No    Elevated toilet seat or a handicapped toilet? Yes         Most Recent Social Determinants of Health (Including Hx of Tobacco, Alcohol, and Drug Use) SDOH Screenings   Food Insecurity: No Food Insecurity (10/07/2023)  Housing: Low Risk  (10/07/2023)  Transportation Needs: No Transportation Needs (10/07/2023)  Utilities: Not At Risk (10/07/2023)  Alcohol Screen: Low Risk  (10/07/2023)  Depression (PHQ2-9): Low Risk  (10/07/2023)  Financial Resource Strain: Low Risk  (10/07/2023)  Physical Activity: Insufficiently Active (10/07/2023)  Social Connections: Unknown (10/07/2023)  Stress: No Stress Concern Present (10/07/2023)  Tobacco Use: Medium Risk (10/07/2023)  Health Literacy: Adequate Health Literacy (10/07/2023)   Social History   Tobacco Use   Smoking status: Former    Current packs/day: 0.00    Types: Cigarettes    Quit date: 11/08/2000    Years since quitting: 22.9   Smokeless tobacco: Never  Substance Use Topics   Alcohol use: Yes    Comment: 1-2 glasses wine daily   Drug use: No   Most Recent Functional Status Assessment:    10/07/2023    1:58 PM  In your present state of health, do you have any difficulty performing the following activities:  Hearing? 0  Vision? 0  Difficulty concentrating or making decisions? 0  Walking or climbing stairs? 1  Dressing or bathing? 0  Doing errands, shopping? 0  Preparing Food and eating ? N  Using the Toilet? N  In the past six months, have you accidently leaked urine? Y  Do you have problems with loss of bowel control? N  Managing your Medications? N  Managing your  Finances? N  Housekeeping or managing your Housekeeping? N   Most Recent Fall Risk Assessment:    10/07/2023    2:28 PM  Fall Risk   Falls in the past year? 0  Number falls in past yr: 0  Injury with Fall? 0  Risk for fall due to : No Fall Risks  Follow up Falls prevention discussed;Education provided;Falls evaluation completed   Most Recent Anxiety/Depression Screenings:    10/07/2023    1:59 PM 06/11/2023    2:28 PM  PHQ 2/9 Scores  PHQ - 2 Score 0 0      06/11/2023    2:28 PM  GAD 7 : Generalized Anxiety Score  Nervous, Anxious, on Edge 0  Control/stop worrying 0  Worry too much - different things 0  Trouble relaxing 0  Restless 0  Easily annoyed or irritable 0  Afraid - awful might happen 0  Total GAD 7 Score 0   Most Recent Cognitive Screening:    10/07/2023    1:59 PM  6CIT Screen  What Year? 0 points  What month? 0 points  What time? 0 points  Count back from 20 0 points  Months in reverse 0 points  Repeat phrase 0 points  Total Score 0 points   Most Recent  Vision/Hearing Screenings: Hearing Screening   500Hz  2000Hz  3000Hz  4000Hz   Right ear Pass Pass Pass Pass  Left ear Pass Pass Pass Pass   Vision Screening   Right eye Left eye Both eyes  Without correction     With correction 20/20 20/20 20/20    Results:  Studies Obtained And Personally Reviewed By Me:  PAP Smear 10/01/2023 normal.  Mammogram 09/21/2022 normal.  Bone Density 09/03/2021  T-score at Right Femur Neck was -1.6, osteopenic.   EKG 10/07/2023 RBBB noted; otherwise normal. No prior tracing for comparison.   Labs:  CBC w/ Differential Lab Results  Component Value Date   WBC 7.1 10/05/2023   RBC 4.20 10/05/2023   HGB 13.8 10/05/2023   HCT 42.8 10/05/2023   PLT 272 10/05/2023   MCV 101.9 (H) 10/05/2023   MCH 32.9 10/05/2023   MCHC 32.2 10/05/2023   RDW 11.6 10/05/2023   MPV 11.7 10/05/2023   LYMPHSABS 1,656 08/31/2022   MONOABS 0.7 10/23/2014   BASOSABS 43 10/05/2023     Comprehensive Metabolic Panel Lab Results  Component Value Date   NA 140 10/05/2023   K 4.1 10/05/2023   CL 101 10/05/2023   CO2 30 10/05/2023   GLUCOSE 103 (H) 10/05/2023   BUN 23 10/05/2023   CREATININE 0.83 10/05/2023   CALCIUM 10.3 10/05/2023   PROT 6.8 10/05/2023   ALBUMIN 4.0 10/23/2014   AST 27 10/05/2023   ALT 27 10/05/2023   ALKPHOS 110 10/23/2014   BILITOT 0.5 10/05/2023   EGFR 67 07/19/2023   GFRNONAA 78 03/22/2020   Lipid Panel  Lab Results  Component Value Date   CHOL 185 10/05/2023   HDL 107 10/05/2023   LDLCALC 64 10/05/2023   TRIG 61 10/05/2023   A1c Lab Results  Component Value Date   HGBA1C 5.7 (H) 10/05/2023    TSH Lab Results  Component Value Date   TSH 2.27 10/05/2023   Results for orders placed or performed in visit on 10/07/23  POCT URINALYSIS DIP (CLINITEK)  Result Value Ref Range   Color, UA yellow yellow   Clarity, UA clear clear   Glucose, UA negative negative mg/dL   Bilirubin, UA negative negative   Ketones, POC UA negative negative mg/dL   Spec Grav, UA 8.989 8.989 - 1.025   Blood, UA negative negative   pH, UA 6.5 5.0 - 8.0   POC PROTEIN,UA negative negative, trace   Urobilinogen, UA 0.2 0.2 or 1.0 E.U./dL   Nitrite, UA Negative Negative   Leukocytes, UA Negative Negative   Assessment & Plan:   Orders Placed This Encounter  Procedures   DG Bone Density    Standing Status:   Future    Expiration Date:   10/06/2024    Reason for Exam (SYMPTOM  OR DIAGNOSIS REQUIRED):   postmenopausal estrogen deficiency, SCREENING FOR OSTEOPOROSIS    [facility]   MM 3D SCREENING MAMMOGRAM BILATERAL BREAST    Standing Status:   Future    Expiration Date:   10/06/2024    Reason for Exam (SYMPTOM  OR DIAGNOSIS REQUIRED):   SCREENING MAMMOGRAM FOR BRREAST CANCER    Preferred imaging location?:   [facility]   POCT URINALYSIS DIP (CLINITEK)   EKG 12-Lead  Welcome To Medicare Preventive Visit; Routine  General Medical Examination At A Health Care Facility;  Encounter For Medicare Annual Wellness Exam Other Labs Reviewed today: CBC w/ Differential, compared to 7/01/224: MCV 101.9, elevated from 98.6; otherwise WNL Comprehensive  Metabolic Panel, compared to 08/31/22: Glucose 103, elevated from 93; otherwise WNL Lipid Panel: WNL HgbA1c, compared to 07/2021: 5.7, elevated from 5.5. TSH: 2.27 Reviewed 10/07/2023 EKG  RBBB noted; otherwise normal. No prior tracing for comparison.  Discussed her recent Situational Stress regarding her youngest son who has moved back in with her after he discovered that funding for research he'd been working on was halted. Along with him, he brought his two dogs, one of which is a puppy.   Vitamin-D Deficiency treated with 50,000 units weekly. 08/31/2022 Vitamin-D: 47.   Dependent Edema treated with Lasix  40 mg daily. Blood Pressure: normotensive today at 130/80. 10/05/2023 Potassium WNL at 4.1.  Glucose Intolerance: since April 2025, her glucose has remained elevated above 100, though has not been higher than 111 and her HgbA1c is barely elevated at 5.7, which is the highest it's ever risen to.  Right Knee Pain, which began in January of 2024 that eventually resolved in May, however she then began having pain in her upper right leg. According to notes from [facility], she did have a transforaminal injection 09/25/2022, which provided immense relief. Additionally, at the time did have lingering weakness in right knee and numbness/tingling in right upper leg associated w/ slight tingling in the lower leg. For her pain, she tried Gabapentin and Meloxicam , which did not provide any relief, and similar results with Celebrex 200 mg daily. This has reportedly improved some.   Followed by Gynecologist, Dr. Nikki; PAP Smear 10/01/2023 normal. Bimanual deferred.  Continue to f/u with Dr. Nikki as needed  Mammogram 09/21/2022 normal with repeat recommendation of 2025.  Repeat  mammogram ordered.  History of Crohn's Disease s/p Colectomy & Ilectomy attachment. Followed by Dr. Rosalie, GI, who did a Sigmoidoscopy on her in 2018. Last seen there 03/01/2023.   Continue to f/u with Dr. Rosalie as needed.   Postmenopausal Estrogen Deficiency; Bone Density 09/03/2021  T-score at Right Femur Neck was -1.6, osteopenic. History of Osteopenia previously treated with Fosamax, but stopped 2008.  Ordered repeat Bone Density.  Vaccine Counseling: Due for Influenza, Shingrix 1st dose, and Hepatitis C Screening; UTD on PNA and Tdap.  Discussed, she plans to update Influenza this fall, she wasn't sure if she'd already had her Shingrix but will get this, and declines Hepatitis C Screening.  Return in about 1 year (around 10/07/2024) for annual labs, annual visit, or as needed.   Welcome to Medicare done today including the all of the following: Reviewed patient's Family Medical History Reviewed patient's SDOH and reviewed tobacco, alcohol, and drug use.  Reviewed and updated list of patient's medical providers Assessment of cognitive impairment was done Assessed patient's functional ability Established a written schedule for health screening services Health Risk Assessent Completed and Reviewed  Discussed health benefits of physical activity, and encouraged her to engage in regular exercise appropriate for her age and condition.   I,Emily Lagle,acting as a Neurosurgeon for Ronal JINNY Hailstone, MD.,have documented all relevant documentation on the behalf of Ronal JINNY Hailstone, MD,as directed by  Ronal JINNY Hailstone, MD while in the presence of Ronal JINNY Hailstone, MD.   I, Ronal JINNY Hailstone, MD, have reviewed all documentation for this visit. The documentation on 10/07/2023 for the exam, diagnosis, procedures, and orders are all accurate and complete.

## 2023-10-07 NOTE — Patient Instructions (Addendum)
 Next appointment: Follow up in one year for your annual wellness visit    Preventive Care 65 Years and Older, Female Preventive care refers to lifestyle choices and visits with your health care provider that can promote health and wellness. What does preventive care include? A yearly physical exam. This is also called an annual well check. Dental exams once or twice a year. Routine eye exams. Ask your health care provider how often you should have your eyes checked. Personal lifestyle choices, including: Daily care of your teeth and gums. Regular physical activity. Eating a healthy diet. Avoiding tobacco and drug use. Limiting alcohol use. Practicing safe sex. Taking low-dose aspirin  every day. Taking vitamin and mineral supplements as recommended by your health care provider. What happens during an annual well check? The services and screenings done by your health care provider during your annual well check will depend on your age, overall health, lifestyle risk factors, and family history of disease. Counseling  Your health care provider may ask you questions about your: Alcohol use. Tobacco use. Drug use. Emotional well-being. Home and relationship well-being. Sexual activity. Eating habits. History of falls. Memory and ability to understand (cognition). Work and work Astronomer. Reproductive health. Screening  You may have the following tests or measurements: Height, weight, and BMI. Blood pressure. Lipid and cholesterol levels. These may be checked every 5 years, or more frequently if you are over 15 years old. Skin check. Lung cancer screening. You may have this screening every year starting at age 72 if you have a 30-pack-year history of smoking and currently smoke or have quit within the past 15 years. Fecal occult blood test (FOBT) of the stool. You may have this test every year starting at age 21. Flexible sigmoidoscopy or colonoscopy. You may have a  sigmoidoscopy every 5 years or a colonoscopy every 10 years starting at age 49. Hepatitis C blood test. Hepatitis B blood test. Sexually transmitted disease (STD) testing. Diabetes screening. This is done by checking your blood sugar (glucose) after you have not eaten for a while (fasting). You may have this done every 1-3 years. Bone density scan. This is done to screen for osteoporosis. You may have this done starting at age 26. Mammogram. This may be done every 1-2 years. Talk to your health care provider about how often you should have regular mammograms. Talk with your health care provider about your test results, treatment options, and if necessary, the need for more tests. Vaccines  Your health care provider may recommend certain vaccines, such as: Influenza vaccine. This is recommended every year. Tetanus, diphtheria, and acellular pertussis (Tdap, Td) vaccine. You may need a Td booster every 10 years. Zoster vaccine. You may need this after age 41. Pneumococcal 13-valent conjugate (PCV13) vaccine. One dose is recommended after age 6. Pneumococcal polysaccharide (PPSV23) vaccine. One dose is recommended after age 15. Talk to your health care provider about which screenings and vaccines you need and how often you need them. This information is not intended to replace advice given to you by your health care provider. Make sure you discuss any questions you have with your health care provider. Document Released: 03/15/2015 Document Revised: 11/06/2015 Document Reviewed: 12/18/2014 Elsevier Interactive Patient Education  2017 [employer].  Fall Prevention in the Home Falls can cause injuries. They can happen to people of all ages. There are many things you can do to make your home safe and to help prevent falls. What can I do on the outside of  my home? Regularly fix the edges of walkways and driveways and fix any cracks. Remove anything that might make you trip as you walk through a  door, such as a raised step or threshold. Trim any bushes or trees on the path to your home. Use bright outdoor lighting. Clear any walking paths of anything that might make someone trip, such as rocks or tools. Regularly check to see if handrails are loose or broken. Make sure that both sides of any steps have handrails. Any raised decks and porches should have guardrails on the edges. Have any leaves, snow, or ice cleared regularly. Use sand or salt on walking paths during winter. Clean up any spills in your garage right away. This includes oil or grease spills. What can I do in the bathroom? Use night lights. Install grab bars by the toilet and in the tub and shower. Do not use towel bars as grab bars. Use non-skid mats or decals in the tub or shower. If you need to sit down in the shower, use a plastic, non-slip stool. Keep the floor dry. Clean up any water that spills on the floor as soon as it happens. Remove soap buildup in the tub or shower regularly. Attach bath mats securely with double-sided non-slip rug tape. Do not have throw rugs and other things on the floor that can make you trip. What can I do in the bedroom? Use night lights. Make sure that you have a light by your bed that is easy to reach. Do not use any sheets or blankets that are too big for your bed. They should not hang down onto the floor. Have a firm chair that has side arms. You can use this for support while you get dressed. Do not have throw rugs and other things on the floor that can make you trip. What can I do in the kitchen? Clean up any spills right away. Avoid walking on wet floors. Keep items that you use a lot in easy-to-reach places. If you need to reach something above you, use a strong step stool that has a grab bar. Keep electrical cords out of the way. Do not use floor polish or wax that makes floors slippery. If you must use wax, use non-skid floor wax. Do not have throw rugs and other things  on the floor that can make you trip. What can I do with my stairs? Do not leave any items on the stairs. Make sure that there are handrails on both sides of the stairs and use them. Fix handrails that are broken or loose. Make sure that handrails are as long as the stairways. Check any carpeting to make sure that it is firmly attached to the stairs. Fix any carpet that is loose or worn. Avoid having throw rugs at the top or bottom of the stairs. If you do have throw rugs, attach them to the floor with carpet tape. Make sure that you have a light switch at the top of the stairs and the bottom of the stairs. If you do not have them, ask someone to add them for you. What else can I do to help prevent falls? Wear shoes that: Do not have high heels. Have rubber bottoms. Are comfortable and fit you well. Are closed at the toe. Do not wear sandals. If you use a stepladder: Make sure that it is fully opened. Do not climb a closed stepladder. Make sure that both sides of the stepladder are locked into place. Ask  someone to hold it for you, if possible. Clearly mark and make sure that you can see: Any grab bars or handrails. First and last steps. Where the edge of each step is. Use tools that help you move around (mobility aids) if they are needed. These include: Canes. Walkers. Scooters. Crutches. Turn on the lights when you go into a dark area. Replace any light bulbs as soon as they burn out. Set up your furniture so you have a clear path. Avoid moving your furniture around. If any of your floors are uneven, fix them. If there are any pets around you, be aware of where they are. Review your medicines with your doctor. Some medicines can make you feel dizzy. This can increase your chance of falling. Ask your doctor what other things that you can do to help prevent falls. This information is not intended to replace advice given to you by your health care provider. Make sure you discuss any  questions you have with your health care provider. Document Released: 12/13/2008 Document Revised: 07/25/2015 Document Reviewed: 03/23/2014 Elsevier Interactive Patient Education  2017 [employer]  Please have bone density study. Vaccines discussed. Continue ciurent meds and return in one year or as needed. Continue Vitamin D  supplement. Watch diet due to history of glucose intolerance.

## 2023-10-09 ENCOUNTER — Ambulatory Visit: Payer: Self-pay | Admitting: Obstetrics and Gynecology

## 2023-10-19 ENCOUNTER — Other Ambulatory Visit: Payer: Self-pay

## 2023-10-19 MED ORDER — FUROSEMIDE 40 MG PO TABS
40.0000 mg | ORAL_TABLET | Freq: Every day | ORAL | 3 refills | Status: AC
Start: 2023-10-19 — End: ?

## 2023-10-27 ENCOUNTER — Ambulatory Visit
Admission: RE | Admit: 2023-10-27 | Discharge: 2023-10-27 | Disposition: A | Discharge status: Home / Self Care | Source: Ambulatory Visit | Attending: Internal Medicine | Admitting: Internal Medicine

## 2023-10-27 DIAGNOSIS — Z1231 Encounter for screening mammogram for malignant neoplasm of breast: Secondary | ICD-10-CM

## 2023-11-23 LAB — HM DEXA SCAN

## 2023-11-25 ENCOUNTER — Encounter: Payer: Self-pay | Admitting: Physical Therapy

## 2023-11-25 ENCOUNTER — Other Ambulatory Visit: Payer: Self-pay

## 2023-11-25 ENCOUNTER — Ambulatory Visit: Attending: Obstetrics and Gynecology | Admitting: Physical Therapy

## 2023-11-25 DIAGNOSIS — N3946 Mixed incontinence: Secondary | ICD-10-CM | POA: Insufficient documentation

## 2023-11-25 DIAGNOSIS — R293 Abnormal posture: Secondary | ICD-10-CM | POA: Diagnosis present

## 2023-11-25 DIAGNOSIS — R279 Unspecified lack of coordination: Secondary | ICD-10-CM | POA: Insufficient documentation

## 2023-11-25 DIAGNOSIS — M6281 Muscle weakness (generalized): Secondary | ICD-10-CM | POA: Diagnosis present

## 2023-11-25 NOTE — Therapy (Unsigned)
 OUTPATIENT PHYSICAL THERAPY FEMALE PELVIC EVALUATION   Patient Name: Beverly [name] MRN: [id number] DOB:1950/04/06, 73 y.o., female Today's Date: 11/25/2023  END OF SESSION:  PT End of Session - 11/25/23 1449     Visit Number 1    Date for Recertification  02/24/24    Authorization Type Medicare    Progress Note Due on Visit 10    PT Start Time 1448    PT Stop Time 1526    PT Time Calculation (min) 38 min    Activity Tolerance Patient tolerated treatment well    Behavior During Therapy WFL for tasks assessed/performed          Past Medical History:  Diagnosis Date   Family history of adverse reaction to anesthesia    pt's mother has hx. of post-op nausea   History of Crohn's disease    Ileostomy in place [facility])    Lichen sclerosus    Osteopenia    Past Surgical History:  Procedure Laterality Date   BREAST REDUCTION SURGERY  2011   ORIF ANKLE FRACTURE Right 06/08/2014   Procedure: OPEN REDUCTION INTERNAL FIXATION (ORIF) RIGHT BIMALLEOLAR ANKLE FRACTURE;  Surgeon: Evalene JONETTA Chancy, MD;  Location: [facility];  Service: Orthopedics;  Laterality: Right;   REDUCTION MAMMAPLASTY Bilateral    STRABISMUS SURGERY     X 2   TOTAL COLECTOMY  2004   TUBAL LIGATION     Patient Active Problem List   Diagnosis Date Noted   History of vitamin D  deficiency 07/28/2013   Lichen sclerosus    Status post bilateral breast reduction 02/07/2012    PCP:    Perri Ronal PARAS, MD    REFERRING PROVIDER:    Cathlyn JAYSON Nikki Bobie FORBES, MD    REFERRING DIAG: [phone] (ICD-10-CM) - Mixed incontinence  THERAPY DIAG:  Muscle weakness (generalized)  Unspecified lack of coordination  Abnormal posture  Rationale for Evaluation and Treatment: Rehabilitation  ONSET DATE: at least 5 years  SUBJECTIVE:                                                                                                                                                                                            SUBJECTIVE STATEMENT: Has ileostomy secondary to Chron's, has had 2 kids. Urinary incontinence with coughing, sneezing, urge, resisted trunk extension at gym causes urinary incontinence.   Fluid intake: 6-7 glasses of fluids (most coffee)  FUNCTIONAL LIMITATIONS: careful with outings with fear of urinary incontinence   PERTINENT HISTORY:  Medications for current condition: no Osteopenia, Lichen sclerosus, Crohn's disease, TOTAL COLECTOMY,   Sexual abuse: No  DIAGNOSTIC FINDINGS:  Post-void residual: Voiding Cystourethrogram (VCUG):  Ultrasound: PAIN:  Are you having pain? No   PRECAUTIONS: None  RED FLAGS: None   WEIGHT BEARING RESTRICTIONS: No  FALLS:  Has patient fallen in last 6 months? No  OCCUPATION: retired   ACTIVITY LEVEL : increasing since recent retirement, now going to PT for back pain then once discharged now goes to [facility] (2x weekly)  PLOF: Independent  PATIENT GOALS: to have no urinary incontinent,minimize risk of worsening urinary incontinence    BOWEL MOVEMENT: Has ileostomy    URINATION: Pain with urination: No Fully empty bladder: No needs to strain to empty sometimes                                 Post-void dribble: No Stream: Strong and Weak Urgency: No Frequency:during the day not usually but does if she takes lasix                                                           Nocturia: Yes:  1x   Leakage: Urge to void, Walking to the bathroom, Coughing, Sneezing, and Exercise Pads/briefs: Yes: 1 pad if needed  INTERCOURSE:  Not active  PREGNANCY: Vaginal deliveries 2 Tearing  Episiotomy Yes x2 C-section deliveries 0 Currently pregnant No  PROLAPSE: None   OBJECTIVE:  Note: Objective measures were completed at Evaluation unless otherwise noted.  DIAGNOSTIC FINDINGS:  ***  PATIENT SURVEYS:  {rehab surveys:24030}  PFIQ-7: ***  COGNITION: Overall cognitive status: {cognition:24006}     SENSATION: Light  touch: {intact/deficits:24005}  LUMBAR SPECIAL TESTS:  {lumbar special test:25242}  FUNCTIONAL TESTS:  {Functional tests:24029} Single leg stance:  Rt:  Lt: Sit-up test: Squat: Bed mobility:  GAIT: Assistive device utilized: {Assistive devices:23999} Comments: ***  POSTURE: {posture:25561}   LUMBARAROM/PROM:  A/PROM A/PROM  Eval (% available)  Flexion   Extension   Right lateral flexion   Left lateral flexion   Right rotation   Left rotation    (Blank rows = not tested)  LOWER EXTREMITY ROM:  {AROM/PROM:27142} ROM Right eval Left eval  Hip flexion    Hip extension    Hip abduction    Hip adduction    Hip internal rotation    Hip external rotation    Knee flexion    Knee extension    Ankle dorsiflexion    Ankle plantarflexion    Ankle inversion    Ankle eversion     (Blank rows = not tested)  LOWER EXTREMITY MMT:  MMT Right eval Left eval  Hip flexion    Hip extension    Hip abduction    Hip adduction    Hip internal rotation    Hip external rotation    Knee flexion    Knee extension    Ankle dorsiflexion    Ankle plantarflexion    Ankle inversion    Ankle eversion     (Blank rows = not tested) PALPATION:  General: ***  Pelvic Alignment: ***  Abdominal: ***  Diastasis: {Yes/No:304960894}*** Distortion: {YES/NO AS:20300}  Breathing: *** Scar tissue: {Yes/No:304960894}***                External Perineal Exam: ***  Internal Pelvic Floor: ***  Patient confirms identification and approves PT to assess internal pelvic floor and treatment {yes/no:20286}  PELVIC MMT:   MMT eval  Vaginal   Internal Anal Sphincter   External Anal Sphincter   Puborectalis   Diastasis Recti   (Blank rows = not tested)        TONE: ***  PROLAPSE: ***  TODAY'S TREATMENT:                                                                                                                              DATE: ***  EVAL  ***   PATIENT EDUCATION:  Education details: *** Person educated: {Person educated:25204} Education method: {Education Method:25205} Education comprehension: {Education Comprehension:25206}  HOME EXERCISE PROGRAM: ***  ASSESSMENT:  CLINICAL IMPRESSION: Patient is a *** y.o. *** who was seen today for physical therapy evaluation and treatment for ***.   OBJECTIVE IMPAIRMENTS: {opptimpairments:25111}.   ACTIVITY LIMITATIONS: {activitylimitations:27494}  PARTICIPATION LIMITATIONS: {participationrestrictions:25113}  PERSONAL FACTORS: {Personal factors:25162} are also affecting patient's functional outcome.   REHAB POTENTIAL: {rehabpotential:25112}  CLINICAL DECISION MAKING: {clinical decision making:25114}  EVALUATION COMPLEXITY: {Evaluation complexity:25115}   GOALS: Goals reviewed with patient? {yes/no:20286}  SHORT TERM GOALS: Target date: ***  *** Baseline: Goal status: INITIAL  2.  *** Baseline:  Goal status: INITIAL  3.  *** Baseline:  Goal status: INITIAL  4.  *** Baseline:  Goal status: INITIAL  5.  *** Baseline:  Goal status: INITIAL  6.  *** Baseline:  Goal status: INITIAL  LONG TERM GOALS: Target date: ***  *** Baseline:  Goal status: INITIAL  2.  *** Baseline:  Goal status: INITIAL  3.  *** Baseline:  Goal status: INITIAL  4.  *** Baseline:  Goal status: INITIAL  5.  *** Baseline:  Goal status: INITIAL  6.  *** Baseline:  Goal status: INITIAL  PLAN:  PT FREQUENCY: {rehab frequency:25116}  PT DURATION: {rehab duration:25117}  PLANNED INTERVENTIONS: {rehab planned interventions:25118::97110-Therapeutic exercises,97530- Therapeutic [phone]- Neuromuscular re-education,97535- Self Rjmz,02859- Manual therapy,Patient/Family education}  PLAN FOR NEXT SESSION: ***   Darryle Navy, PT, DPT 09/25/252:51 PM  [facility] [address], [address] [place], [place]  [address] Phone # [phone] Fax [phone]

## 2023-11-25 NOTE — Patient Instructions (Signed)

## 2023-11-26 ENCOUNTER — Ambulatory Visit: Payer: Self-pay | Admitting: Internal Medicine

## 2023-11-26 ENCOUNTER — Encounter: Payer: Self-pay | Admitting: Internal Medicine

## 2023-12-16 ENCOUNTER — Other Ambulatory Visit (HOSPITAL_BASED_OUTPATIENT_CLINIC_OR_DEPARTMENT_OTHER): Payer: Self-pay

## 2023-12-16 MED ORDER — COMIRNATY 30 MCG/0.3ML IM SUSY
0.3000 mL | PREFILLED_SYRINGE | Freq: Once | INTRAMUSCULAR | 0 refills | Status: AC
  Filled 2023-12-16: qty 0.3, 1d supply, fill #0

## 2023-12-16 MED ORDER — FLUZONE HIGH-DOSE 0.5 ML IM SUSY
0.5000 mL | PREFILLED_SYRINGE | Freq: Once | INTRAMUSCULAR | 0 refills | Status: AC
  Filled 2023-12-16: qty 0.5, 1d supply, fill #0

## 2024-01-10 ENCOUNTER — Ambulatory Visit: Attending: Obstetrics and Gynecology | Admitting: Physical Therapy

## 2024-01-10 ENCOUNTER — Encounter: Payer: Self-pay | Admitting: Physical Therapy

## 2024-01-10 DIAGNOSIS — R279 Unspecified lack of coordination: Secondary | ICD-10-CM | POA: Insufficient documentation

## 2024-01-10 DIAGNOSIS — R293 Abnormal posture: Secondary | ICD-10-CM | POA: Insufficient documentation

## 2024-01-10 DIAGNOSIS — M6281 Muscle weakness (generalized): Secondary | ICD-10-CM | POA: Insufficient documentation

## 2024-01-10 NOTE — Therapy (Signed)
 OUTPATIENT PHYSICAL THERAPY FEMALE PELVIC TREATMENT   Patient Name: Beverly [name] MRN: [id number] DOB:1950-04-06, 73 y.o., female Today's Date: 01/10/2024  END OF SESSION:  PT End of Session - 01/10/24 1404     Visit Number 2    Date for Recertification  02/24/24    Authorization Type Medicare    Authorization - Visit Number 2    Progress Note Due on Visit 10    PT Start Time 1400    PT Stop Time 1440    PT Time Calculation (min) 40 min    Activity Tolerance Patient tolerated treatment well    Behavior During Therapy WFL for tasks assessed/performed          Past Medical History:  Diagnosis Date   Family history of adverse reaction to anesthesia    pt's mother has hx. of post-op nausea   History of Crohn's disease    Ileostomy in place [facility])    Lichen sclerosus    Osteopenia    Past Surgical History:  Procedure Laterality Date   BREAST REDUCTION SURGERY  2011   ORIF ANKLE FRACTURE Right 06/08/2014   Procedure: OPEN REDUCTION INTERNAL FIXATION (ORIF) RIGHT BIMALLEOLAR ANKLE FRACTURE;  Surgeon: Evalene JONETTA Chancy, MD;  Location:  SUR[facility]ERY CENTER;  Service: Orthopedics;  Laterality: Right;   REDUCTION MAMMAPLASTY Bilateral    STRABISMUS SURGERY     X 2   TOTAL COLECTOMY  2004   TUBAL LIGATION     Patient Active Problem List   Diagnosis Date Noted   History of vitamin D  deficiency 07/28/2013   Lichen sclerosus    Status post bilateral breast reduction 02/07/2012    PCP:    Perri Ronal PARAS, MD    REFERRING PROVIDER:    Cathlyn JAYSON Nikki Bobie FORBES, MD    REFERRING DIAG: [phone] (ICD-10-CM) - Mixed incontinence  THERAPY DIAG:  Muscle weakness (generalized)  Unspecified lack of coordination  Abnormal posture  Rationale for Evaluation and Treatment: Rehabilitation  ONSET DATE: at least 5 years  SUBJECTIVE:                                                                                                                                                                                            SUBJECTIVE STATEMENT: Has ileostomy secondary to Chron's, has had 2 kids. Urinary incontinence with coughing, sneezing, urge, resisted trunk extension at gym causes urinary incontinence.   Fluid intake: 6-7 glasses of fluids (most coffee)  FUNCTIONAL LIMITATIONS: careful with outings with fear of urinary incontinence   PERTINENT HISTORY:  Medications for current condition: no Osteopenia, Lichen sclerosus, Crohn's disease, TOTAL COLECTOMY,  Sexual abuse: No  DIAGNOSTIC FINDINGS:  Post-void residual: Voiding Cystourethrogram (VCUG):  Ultrasound: PAIN:  Are you having pain? No   PRECAUTIONS: None  RED FLAGS: None   WEIGHT BEARING RESTRICTIONS: No  FALLS:  Has patient fallen in last 6 months? No  OCCUPATION: retired   ACTIVITY LEVEL : increasing since recent retirement, now going to PT for back pain then once discharged now goes to [facility] (2x weekly)  PLOF: Independent  PATIENT GOALS: to have no urinary incontinent,minimize risk of worsening urinary incontinence    BOWEL MOVEMENT: Has ileostomy    URINATION: Pain with urination: No Fully empty bladder: No needs to strain to empty sometimes                                 Post-void dribble: No Stream: Strong and Weak Urgency: No Frequency:during the day not usually but does if she takes lasix                                                           Nocturia: Yes:  1x   Leakage: Urge to void, Walking to the bathroom, Coughing, Sneezing, and Exercise Pads/briefs: Yes: 1 pad if needed  INTERCOURSE:  Not active  PREGNANCY: Vaginal deliveries 2 Tearing  Episiotomy Yes x2 C-section deliveries 0 Currently pregnant No  PROLAPSE: None   OBJECTIVE:  Note: Objective measures were completed at Evaluation unless otherwise noted.  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)  Lt: 8s bu pelvic drop Sit-up test:1/3   GAIT: WFL  POSTURE: rounded shoulders, forward head, and posterior pelvic tilt   LUMBARAROM/PROM:  A/PROM A/PROM  Eval (% available)  Flexion 75  Extension 75  Right lateral flexion 100  Left lateral flexion 100  Right rotation 75  Left rotation 75   (Blank rows = not tested)  LOWER EXTREMITY ROM:  Bil hamstrings and adductors limited by 25%  LOWER EXTREMITY MMT:  R hip grossly 3+/5 except flexion 3/5; Lt grossly 4/5 except flexion 3+/5 PALPATION:  General: tightness noted in bil thoracic and lumbar paraspinals   Pelvic Alignment: WFL  Abdominal:   Diastasis: No Distortion: No  Breathing: chest but with cues more diaphragmatic breathing and chest combination  Scar tissue: Yes: vertical scaring throughout full length of abdomen at midline from colectomy, no TTP but profound restrictions throughout and worse at thickened areas at middle and bottom of scaring                 External Perineal Exam: redness throughout vulva and around vaginal opening and bil labia                              Internal Pelvic Floor: TTP throughout superficial and deep layers bil   Patient confirms identification and approves PT to assess internal pelvic floor and treatment Yes No emotional/communication barriers or cognitive limitation. Patient is motivated to learn. Patient understands and agrees with treatment goals and  plan. PT explains patient will be examined in standing, sitting, and lying down to see how their muscles and joints work. When they are ready, they will be asked to remove their underwear so PT can examine their perineum. The patient is also given the option of providing their own chaperone as one is not provided in our facility. The patient also has the right and is  explained the right to defer or refuse any part of the evaluation or treatment including the internal exam. With the patient's consent, PT will use one gloved finger to gently assess the muscles of the pelvic floor, seeing how well it contracts and relaxes and if there is muscle symmetry. After, the patient will get dressed and PT and patient will discuss exam findings and plan of care. PT and patient discuss plan of care, schedule, attendance policy and HEP activities.  PELVIC MMT:   MMT eval  Vaginal 2/5, 9s, 4 reps  Internal Anal Sphincter   External Anal Sphincter   Puborectalis   Diastasis Recti See above  (Blank rows = not tested)        TONE: Tension noted throughout  PROLAPSE: Not seen in hooklying with cough  TODAY'S TREATMENT:                                                                                                                              DATE:    01/10/24 Neuromuscular:  Reviewed the urge to void to help her with her urgency  Educated patient on the [facility] with coughing and sneezing and keeping the distance between the rib cage and pubic bone Sitting on ball performing diaphragmatic breathing and feel the pelvic floor relax Manual: Soft tissue mobilization: Manual work to the diaphragm to work on diaphragmatic breathing Scar tissue mobilization: Manual work to the scar along the lower abdomen to work on tissue mobility Myofascial release: Fascial release around the areas of the ureters and urachus ligament Exercises: Stretches/mobility: Seated piriformis stretch holding 30 sec each Hamstring stretch with leg extended in sitting holding 30 sec each Seated hip adductor stretch holding 30 sec each side    11/25/23 EVAL Examination completed, findings reviewed, pt educated on POC, HEP, and pelvic floor anatomy, proper bladder voiding times and habits, voiding mechanics and pelvic floor PT role in care, urge drill. Pt motivated to participate in PT and agreeable  to attempt recommendations.     PATIENT EDUCATION:  Education details: [id number], urge dirll  Person educated: Patient Education method: Explanation, Demonstration, Tactile cues, Verbal cues, and Handouts Education comprehension: verbalized understanding, returned demonstration, verbal cues required, tactile cues required, and needs further education  HOME EXERCISE PROGRAM: [id number]  ASSESSMENT:  CLINICAL IMPRESSION: Patient is a 73 y.o. female  who was seen today for physical therapy  treatment for urinary incontinence with urge and stressors. Patient understands how to perform manual work to her scar. She was able to feel the pelvic floor relax when sitting  on the ball.  Pt would benefit from additional PT to further address deficits.    OBJECTIVE IMPAIRMENTS: decreased activity tolerance, decreased coordination, decreased endurance, decreased mobility, difficulty walking, decreased strength, increased fascial restrictions, increased muscle spasms, impaired flexibility, improper body mechanics, postural dysfunction, and pain.   ACTIVITY LIMITATIONS: carrying, lifting, squatting, continence, and locomotion level  PARTICIPATION LIMITATIONS: community activity and working out  PERSONAL FACTORS: Time since onset of injury/illness/exacerbation and 1 comorbidity: medical history are also affecting patient's functional outcome.   REHAB POTENTIAL: Good  CLINICAL DECISION MAKING: Stable/uncomplicated  EVALUATION COMPLEXITY: Low   GOALS: Goals reviewed with patient? Yes  SHORT TERM GOALS: Target date: 12/23/23  Pt to be I with HEP for carry over and continuing recommendations for improved outcomes.   Baseline: Goal status: INITIAL   2.  Pt will be independent with the knack, urge suppression technique, and double voiding in order to improve bladder habits and decrease urinary incontinence.   Baseline:  Goal status: INITIAL  3.  Pt to demonstrate improved coordination of pelvic  floor and breathing mechanics with body weight squat with appropriate synergistic patterns to decrease pain and leakage at least 50% of the time for improved ability to complete a  workout without strain at pelvic floor and symptoms.    Baseline:  Goal status: INITIAL   LONG TERM GOALS: Target date: 02/24/24  Pt to be I with advanced HEP for carry over and continuing recommendations for improved outcomes.   Baseline:  Goal status: INITIAL  2.  Pt to demonstrate improved coordination of pelvic floor and breathing mechanics with 10# sit to stands  with appropriate synergistic patterns to decrease pain and leakage at least 75% of the time for improved ability to complete a  workout without strain at pelvic floor and symptoms.    Baseline:  Goal status: INITIAL  3.  Pt to report no more than 1 instance of urinary incontinence in a week for improved confidence with community outings.  Baseline:  Goal status: INITIAL  4.  Pt to report improved comfort and decreased dryness at external pelvic floor due to decreased need of pads and possible use of external moisturizer.  Baseline:  Goal status: INITIAL   PLAN:  PT FREQUENCY: 1-2x/week  PT DURATION: 10 sessions  PLANNED INTERVENTIONS: 97110-Therapeutic exercises, 97530- Therapeutic activity, 97112- Neuromuscular re-education, 97535- Self Care, [id number]- Manual therapy, [phone]- Canalith repositioning, [id number]- Aquatic Therapy, [phone] (1-2 muscles), [id number] (3+ muscles)- Dry Needling, Patient/Family education, Taping, Joint mobilization, Spinal mobilization, Scar mobilization, DME instructions, Cryotherapy, Moist heat, and Biofeedback  PLAN FOR NEXT SESSION:  educate on vaginal moisturizers, manual work to the pelvic floor, core engagement, double voiding   Channing Pereyra, PT 01/10/24 2:44 PM  [facility] [address] [place], [place] [address] Phone # [phone] Fax [phone]

## 2024-01-12 ENCOUNTER — Encounter: Payer: Self-pay | Admitting: Physical Therapy

## 2024-01-12 ENCOUNTER — Ambulatory Visit: Admitting: Physical Therapy

## 2024-01-12 DIAGNOSIS — M6281 Muscle weakness (generalized): Secondary | ICD-10-CM | POA: Diagnosis not present

## 2024-01-12 DIAGNOSIS — R293 Abnormal posture: Secondary | ICD-10-CM

## 2024-01-12 DIAGNOSIS — R279 Unspecified lack of coordination: Secondary | ICD-10-CM

## 2024-01-12 NOTE — Patient Instructions (Addendum)
 Moisturizers They are used in the vagina to hydrate the mucous membrane that make up the vaginal canal. Designed to keep a more normal acid balance (ph) Use daily  at bedtime  Ingredients to avoid is glycerin and fragrance, can increase chance of infection   Creams to use externally on the Vulva area Marathon Oil (good for for cancer patients that had radiation to the area)- amazon or [employer] rubbermaid.[email or web address]/ Vulva Balm/ V-magic cream by medicine mama- amazon Julva-amazon Vital V Wild Yam salve ( help moisturize and help with thinning vulvar area, does have Beeswax MoodMaid Botanical Pro-Meno Wild Yam Cream- Amazon Desert Harvest Gele Cleo by Sherrlyn labial moisturizer (Amazon),  Coconut or olive oil aloe Good Clean Love Enchanted Rose by intimate rose  Things to avoid in the vaginal area Do not use things to irritate the vulvar area No lotions just specialized creams for the vulva area- Neogyn, V-magic,  No soaps; can use Aveeno or Calendula cleanser, unscented Dove if needed. Must be gentle No deodorants No douches Good to sleep without underwear to let the vaginal area to air out No scrubbing: spread the lips to let warm water rinse over labias and pat dry   Double Voiding can be a very useful technique to help overcome incomplete emptying of your bladder.  Incomplete emptying of urine can result in leakage after using the bathroom and increase the risk of urinary tract infection.   Initial Void: When you first sit down to urinate, ensure optimal positioning for bladder emptying by following these guidelines for toileting posture: Sit on the toilet seat - don't hover over the seat Support your trunk by placing your hands on your knees or thighs Spread your knees and hips wide Position your feet flat on the floor or elevate feet on phone books, foot stool (Squatty Potty), or wrapped toilet paper rolls (if having knees above hips helps you empty) Lean forward from your  hips Maintain the normal inward curve in your lower back   Repeated Void: After your initial void is complete, follow these movement patterns and attempt going to the bathroom again. Stand up Rotate your hips as if doing hula hoop in one direction Rotate using the same action in the other direction Rock your hips and pelvis back and forwards (pelvic tilts) Rock your hips and pelvis side to side (tail wag) Sit back down and repeat your voiding technique This technique can be repeated as many times as you choose to help you empty your bladder more effectively.   [facility] [address] [place], [place] [address] Phone # [phone] Fax [phone]

## 2024-01-12 NOTE — Therapy (Signed)
 OUTPATIENT PHYSICAL THERAPY FEMALE PELVIC TREATMENT   Patient Name: Beverly [name] MRN: [id number] DOB:1950-08-26, 73 y.o., female Today's Date: 01/12/2024  END OF SESSION:  PT End of Session - 01/12/24 1400     Visit Number 3    Date for Recertification  02/24/24    Authorization Type Medicare    Authorization - Visit Number 3    Progress Note Due on Visit 10    PT Start Time 1400    PT Stop Time 1440    PT Time Calculation (min) 40 min    Activity Tolerance Patient tolerated treatment well    Behavior During Therapy WFL for tasks assessed/performed          Past Medical History:  Diagnosis Date   Family history of adverse reaction to anesthesia    pt's mother has hx. of post-op nausea   History of Crohn's disease    Ileostomy in place [facility])    Lichen sclerosus    Osteopenia    Past Surgical History:  Procedure Laterality Date   BREAST REDUCTION SURGERY  2011   ORIF ANKLE FRACTURE Right 06/08/2014   Procedure: OPEN REDUCTION INTERNAL FIXATION (ORIF) RIGHT BIMALLEOLAR ANKLE FRACTURE;  Surgeon: Evalene JONETTA Chancy, MD;  Location: [facility];  Service: Orthopedics;  Laterality: Right;   REDUCTION MAMMAPLASTY Bilateral    STRABISMUS SURGERY     X 2   TOTAL COLECTOMY  2004   TUBAL LIGATION     Patient Active Problem List   Diagnosis Date Noted   History of vitamin D  deficiency 07/28/2013   Lichen sclerosus    Status post bilateral breast reduction 02/07/2012    PCP:    Perri Ronal PARAS, MD    REFERRING PROVIDER:    Cathlyn JAYSON Nikki Bobie FORBES, MD    REFERRING DIAG: [phone] (ICD-10-CM) - Mixed incontinence  THERAPY DIAG:  Muscle weakness (generalized)  Unspecified lack of coordination  Abnormal posture  Rationale for Evaluation and Treatment: Rehabilitation  ONSET DATE: at least 5 years  SUBJECTIVE:                                                                                                                                                                                            SUBJECTIVE STATEMENT: I am starting to feel things down there.  Has ileostomy secondary to Chron's, has had 2 kids. Urinary incontinence with coughing, sneezing, urge, resisted trunk extension at gym causes urinary incontinence.   Fluid intake: 6-7 glasses of fluids (most coffee)  FUNCTIONAL LIMITATIONS: careful with outings with fear of urinary incontinence   PERTINENT HISTORY:  Medications for current  condition: no Osteopenia, Lichen sclerosus, Crohn's disease, TOTAL COLECTOMY,   Sexual abuse: No  DIAGNOSTIC FINDINGS:  Post-void residual: Voiding Cystourethrogram (VCUG):  Ultrasound: PAIN:  Are you having pain? No   PRECAUTIONS: None  RED FLAGS: None   WEIGHT BEARING RESTRICTIONS: No  FALLS:  Has patient fallen in last 6 months? No  OCCUPATION: retired   ACTIVITY LEVEL : increasing since recent retirement, now going to PT for back pain then once discharged now goes to [facility] (2x weekly)  PLOF: Independent  PATIENT GOALS: to have no urinary incontinent,minimize risk of worsening urinary incontinence    BOWEL MOVEMENT: Has ileostomy    URINATION: Pain with urination: No Fully empty bladder: No needs to strain to empty sometimes                                 Post-void dribble: No Stream: Strong and Weak Urgency: No Frequency:during the day not usually but does if she takes lasix                                                           Nocturia: Yes:  1x   Leakage: Urge to void, Walking to the bathroom, Coughing, Sneezing, and Exercise Pads/briefs: Yes: 1 pad if needed  INTERCOURSE:  Not active  PREGNANCY: Vaginal deliveries 2 Tearing  Episiotomy Yes x2 C-section deliveries 0 Currently pregnant No  PROLAPSE: None   OBJECTIVE:  Note: Objective measures were completed at Evaluation unless otherwise noted.  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)  Lt: 8s bu pelvic drop Sit-up test:1/3   GAIT: WFL  POSTURE: rounded shoulders, forward head, and posterior pelvic tilt   LUMBARAROM/PROM:  A/PROM A/PROM  Eval (% available)  Flexion 75  Extension 75  Right lateral flexion 100  Left lateral flexion 100  Right rotation 75  Left rotation 75   (Blank rows = not tested)  LOWER EXTREMITY ROM:  Bil hamstrings and adductors limited by 25%  LOWER EXTREMITY MMT:  R hip grossly 3+/5 except flexion 3/5; Lt grossly 4/5 except flexion 3+/5 PALPATION:  General: tightness noted in bil thoracic and lumbar paraspinals   Pelvic Alignment: WFL  Abdominal:   Diastasis: No Distortion: No  Breathing: chest but with cues more diaphragmatic breathing and chest combination  Scar tissue: Yes: vertical scaring throughout full length of abdomen at midline from colectomy, no TTP but profound restrictions throughout and worse at thickened areas at middle and bottom of scaring                 External Perineal Exam: redness throughout vulva and around vaginal opening and bil labia                              Internal Pelvic Floor: TTP throughout superficial and deep layers bil   Patient confirms identification and approves PT to assess internal pelvic floor and treatment Yes No emotional/communication barriers or cognitive limitation. Patient is  motivated to learn. Patient understands and agrees with treatment goals and plan. PT explains patient will be examined in standing, sitting, and lying down to see how their muscles and joints work. When they are ready, they will be asked to remove their underwear so PT can examine their perineum. The patient is also given the option of providing their own chaperone as one is not provided in our  facility. The patient also has the right and is explained the right to defer or refuse any part of the evaluation or treatment including the internal exam. With the patient's consent, PT will use one gloved finger to gently assess the muscles of the pelvic floor, seeing how well it contracts and relaxes and if there is muscle symmetry. After, the patient will get dressed and PT and patient will discuss exam findings and plan of care. PT and patient discuss plan of care, schedule, attendance policy and HEP activities.   01/12/24: patient is not comfortable with therapist performing manual work on the vaginal area and prefers to be educated and done at home.  PELVIC MMT:   MMT eval  Vaginal 2/5, 9s, 4 reps  Internal Anal Sphincter   External Anal Sphincter   Puborectalis   Diastasis Recti See above  (Blank rows = not tested)        TONE: Tension noted throughout  PROLAPSE: Not seen in hooklying with cough  TODAY'S TREATMENT:                                                                                                                              DATE:   01/12/24 Neuromuscular re-education: Core facilitation: Sitting on ball alternate shoulder flexion holding 2 # 20 x Down training: Sitting on ball performing diaphragmatic breathing and feel the pelvic floor relax Sitting on ball with pelvic circles, diagonals, anterior and posterior tilt, pelvic sway Sitting on ball with alternate shoulder flexion with 2# each hand Sitting on ball and punch 2 # wt. Forward 20 x Sitting knee extension 20 x  Therapeutic activities: Functional strengthening activities: Educated patient on how to sit on commode and relax the pelvic floor to push urine out and double void technique Self-care: Educated patient on vaginal moisturizers, how to apply them, how to massage the lichens and when to apply the moisturizer Educated patient on how to massage her own pelvic floor, clitoris, mons pubis to work on  urgency and restricted tissue     01/10/24 Neuromuscular:  Reviewed the urge to void to help her with her urgency  Educated patient on the [facility] with coughing and sneezing and keeping the distance between the rib cage and pubic bone Sitting on ball performing diaphragmatic breathing and feel the pelvic floor relax Manual: Soft tissue mobilization: Manual work to the diaphragm to work on diaphragmatic breathing Scar tissue mobilization: Manual work to the scar along the lower abdomen to work on tissue mobility Myofascial release: Fascial release around the areas  of the ureters and urachus ligament Exercises: Stretches/mobility: Seated piriformis stretch holding 30 sec each Hamstring stretch with leg extended in sitting holding 30 sec each Seated hip adductor stretch holding 30 sec each side    11/25/23 EVAL Examination completed, findings reviewed, pt educated on POC, HEP, and pelvic floor anatomy, proper bladder voiding times and habits, voiding mechanics and pelvic floor PT role in care, urge drill. Pt motivated to participate in PT and agreeable to attempt recommendations.     PATIENT EDUCATION:  01/12/24 Education details: [email or web address], urge dirll , education on vaginal moisturizers, education on urinating Person educated: Patient Education method: Explanation, Demonstration, Tactile cues, Verbal cues, and Handouts Education comprehension: verbalized understanding, returned demonstration, verbal cues required, tactile cues required, and needs further education  HOME EXERCISE PROGRAM: 01/12/24 Access Code: [email or web address] URL: [email or web address] Date: 01/12/2024 Prepared by: Channing Pereyra  Exercises - Seated Diaphragmatic Breathing  - 1 x daily - 7 x weekly - 2 sets - 10 reps - Seated Pelvic Floor Contraction  - 1 x daily - 7 x weekly - 2 sets - 10 reps - Seated Cough with Pelvic Floor Contraction and Hand to Mouth  - 1 x daily - 7 x weekly - 2 sets - 10 reps - pelvic  floor contract and holds  - 1 x daily - 7 x weekly - 2 sets - 10 reps - 10s holds - Seated Piriformis Stretch with Trunk Bend  - 1 x daily - 7 x weekly - 1 sets - 2 reps - 30 s hold - Seated Hamstring Stretch  - 1 x daily - 7 x weekly - 1 sets - 2 reps - 30 s hold - Seated Hip Adductor Stretch on Swiss Ball  - 1 x daily - 7 x weekly - 1 sets - 2 reps - 30 s hold - Alternating Shoulder Flexion Seated on Swiss Ball  - 1 x daily - 2 x weekly - 2 sets - 10 reps - Seated Lateral Pelvic Tilt on Swiss Ball  - 1 x daily - 2 x weekly - 1 sets - 10 reps - Seated Swiss Ball Pelvic Circles  - 1 x daily - 2 x weekly - 1 sets - 10 reps - Seated Diagonals With Medicine Ball on [employer]  - 1 x daily - 2 x weekly - 1 sets - 10 reps - Swiss Ball Knee Extension  - 1 x daily - 2 x weekly - 10 reps  ASSESSMENT:  CLINICAL IMPRESSION: Patient is a 73 y.o. female  who was seen today for physical therapy  treatment for urinary incontinence with urge and stressors. Patient understands how to perform manual work to her scar. She was able to feel the pelvic floor relax when sitting on the ball.  She is feeling the pelvic floor and abdominals contract with the exercises. She is getting a ball because she likes to exercise on it. Pt would benefit from additional PT to further address deficits.    OBJECTIVE IMPAIRMENTS: decreased activity tolerance, decreased coordination, decreased endurance, decreased mobility, difficulty walking, decreased strength, increased fascial restrictions, increased muscle spasms, impaired flexibility, improper body mechanics, postural dysfunction, and pain.   ACTIVITY LIMITATIONS: carrying, lifting, squatting, continence, and locomotion level  PARTICIPATION LIMITATIONS: community activity and working out  PERSONAL FACTORS: Time since onset of injury/illness/exacerbation and 1 comorbidity: medical history are also affecting patient's functional outcome.   REHAB POTENTIAL: Good  CLINICAL  DECISION MAKING: Stable/uncomplicated  EVALUATION COMPLEXITY: Low   GOALS: Goals  reviewed with patient? Yes  SHORT TERM GOALS: Target date: 12/23/23  Pt to be I with HEP for carry over and continuing recommendations for improved outcomes.   Baseline: Goal status: INITIAL   2.  Pt will be independent with the knack, urge suppression technique, and double voiding in order to improve bladder habits and decrease urinary incontinence.   Baseline:  Goal status: 01/12/24  3.  Pt to demonstrate improved coordination of pelvic floor and breathing mechanics with body weight squat with appropriate synergistic patterns to decrease pain and leakage at least 50% of the time for improved ability to complete a  workout without strain at pelvic floor and symptoms.    Baseline:  Goal status: INITIAL   LONG TERM GOALS: Target date: 02/24/24  Pt to be I with advanced HEP for carry over and continuing recommendations for improved outcomes.   Baseline:  Goal status: INITIAL  2.  Pt to demonstrate improved coordination of pelvic floor and breathing mechanics with 10# sit to stands  with appropriate synergistic patterns to decrease pain and leakage at least 75% of the time for improved ability to complete a  workout without strain at pelvic floor and symptoms.    Baseline:  Goal status: INITIAL  3.  Pt to report no more than 1 instance of urinary incontinence in a week for improved confidence with community outings.  Baseline:  Goal status: INITIAL  4.  Pt to report improved comfort and decreased dryness at external pelvic floor due to decreased need of pads and possible use of external moisturizer.  Baseline:  Goal status: INITIAL   PLAN:  PT FREQUENCY: 1-2x/week  PT DURATION: 10 sessions  PLANNED INTERVENTIONS: 97110-Therapeutic exercises, 97530- Therapeutic activity, 97112- Neuromuscular re-education, 97535- Self Care, [id number]- Manual therapy, [phone]- Canalith repositioning, [id number]- Aquatic  Therapy, [phone] (1-2 muscles), [phone] (3+ muscles)- Dry Needling, Patient/Family education, Taping, Joint mobilization, Spinal mobilization, Scar mobilization, DME instructions, Cryotherapy, Moist heat, and Biofeedback  PLAN FOR NEXT SESSION:   core engagement, see if patient has done her own pelvic floor work around the introitus, mons pubis, and clitoris   Channing Pereyra, PT 01/12/24 2:45 PM  [facility] [address] [place], [place] [address] Phone # [phone] Fax [phone]

## 2024-01-17 ENCOUNTER — Ambulatory Visit: Admitting: Physical Therapy

## 2024-01-17 DIAGNOSIS — M6281 Muscle weakness (generalized): Secondary | ICD-10-CM | POA: Diagnosis not present

## 2024-01-17 DIAGNOSIS — R293 Abnormal posture: Secondary | ICD-10-CM

## 2024-01-17 DIAGNOSIS — R279 Unspecified lack of coordination: Secondary | ICD-10-CM

## 2024-01-17 NOTE — Therapy (Signed)
 OUTPATIENT PHYSICAL THERAPY FEMALE PELVIC TREATMENT   Patient Name: Beverly [name] MRN: [id number] DOB:Nov 28, 1950, 73 y.o., female Today's Date: 01/17/2024  END OF SESSION:  PT End of Session - 01/17/24 1406     Visit Number 4    Date for Recertification  02/24/24    Authorization Type Medicare    Authorization - Visit Number 4    Progress Note Due on Visit 10    PT Start Time 1402    PT Stop Time 1443    PT Time Calculation (min) 41 min    Activity Tolerance Patient tolerated treatment well    Behavior During Therapy WFL for tasks assessed/performed           Past Medical History:  Diagnosis Date   Family history of adverse reaction to anesthesia    pt's mother has hx. of post-op nausea   History of Crohn's disease    Ileostomy in place [facility])    Lichen sclerosus    Osteopenia    Past Surgical History:  Procedure Laterality Date   BREAST REDUCTION SURGERY  2011   ORIF ANKLE FRACTURE Right 06/08/2014   Procedure: OPEN REDUCTION INTERNAL FIXATION (ORIF) RIGHT BIMALLEOLAR ANKLE FRACTURE;  Surgeon: Evalene JONETTA Chancy, MD;  Location: [facility];  Service: Orthopedics;  Laterality: Right;   REDUCTION MAMMAPLASTY Bilateral    STRABISMUS SURGERY     X 2   TOTAL COLECTOMY  2004   TUBAL LIGATION     Patient Active Problem List   Diagnosis Date Noted   History of vitamin D  deficiency 07/28/2013   Lichen sclerosus    Status post bilateral breast reduction 02/07/2012    PCP:    Perri Ronal PARAS, MD    REFERRING PROVIDER:    Cathlyn JAYSON Nikki Bobie FORBES, MD    REFERRING DIAG: [phone] (ICD-10-CM) - Mixed incontinence  THERAPY DIAG:  Muscle weakness (generalized)  Unspecified lack of coordination  Abnormal posture  Rationale for Evaluation and Treatment: Rehabilitation  ONSET DATE: at least 5 years  SUBJECTIVE:                                                                                                                                                                                            SUBJECTIVE STATEMENT: Bought a ball and this has been helpful for relaxation at pelvic floor, is using a moisturizer at vulva.  Urge drill has been pretty successful but not consistent. Techniques for fully emptying bladder going well.    Has ileostomy secondary to Chron's, has had 2 kids. Urinary incontinence with coughing, sneezing, urge, resisted trunk extension at gym causes  urinary incontinence.   Fluid intake: 6-7 glasses of fluids (most coffee)  FUNCTIONAL LIMITATIONS: careful with outings with fear of urinary incontinence   PERTINENT HISTORY:  Medications for current condition: no Osteopenia, Lichen sclerosus, Crohn's disease, TOTAL COLECTOMY,   Sexual abuse: No  DIAGNOSTIC FINDINGS:  Post-void residual: Voiding Cystourethrogram (VCUG):  Ultrasound: PAIN:  Are you having pain? No   PRECAUTIONS: None  RED FLAGS: None   WEIGHT BEARING RESTRICTIONS: No  FALLS:  Has patient fallen in last 6 months? No  OCCUPATION: retired   ACTIVITY LEVEL : increasing since recent retirement, now going to PT for back pain then once discharged now goes to [facility] (2x weekly)  PLOF: Independent  PATIENT GOALS: to have no urinary incontinent,minimize risk of worsening urinary incontinence    BOWEL MOVEMENT: Has ileostomy    URINATION: Pain with urination: No Fully empty bladder: No needs to strain to empty sometimes                                 Post-void dribble: No Stream: Strong and Weak Urgency: No Frequency:during the day not usually but does if she takes lasix                                                           Nocturia: Yes:  1x   Leakage: Urge to void, Walking to the bathroom, Coughing, Sneezing, and Exercise Pads/briefs: Yes: 1 pad if needed  INTERCOURSE:  Not active  PREGNANCY: Vaginal deliveries 2 Tearing  Episiotomy Yes x2 C-section deliveries 0 Currently pregnant  No  PROLAPSE: None   OBJECTIVE:  Note: Objective measures were completed at Evaluation unless otherwise noted.  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)  Lt: 8s bu pelvic drop Sit-up test:1/3   GAIT: WFL  POSTURE: rounded shoulders, forward head, and posterior pelvic tilt   LUMBARAROM/PROM:  A/PROM A/PROM  Eval (% available)  Flexion 75  Extension 75  Right lateral flexion 100  Left lateral flexion 100  Right rotation 75  Left rotation 75   (Blank rows = not tested)  LOWER EXTREMITY ROM:  Bil hamstrings and adductors limited by 25%  LOWER EXTREMITY MMT:  R hip grossly 3+/5 except flexion 3/5; Lt grossly 4/5 except flexion 3+/5 PALPATION:  General: tightness noted in bil thoracic and lumbar paraspinals   Pelvic Alignment: WFL  Abdominal:   Diastasis: No Distortion: No  Breathing: chest but with cues more diaphragmatic breathing and chest combination  Scar tissue: Yes: vertical scaring throughout full length of abdomen at midline from colectomy, no TTP but profound restrictions throughout and worse at thickened areas at middle and bottom of scaring                 External Perineal Exam: redness throughout vulva and around vaginal opening and bil labia                              Internal Pelvic Floor:  TTP throughout superficial and deep layers bil   Patient confirms identification and approves PT to assess internal pelvic floor and treatment Yes No emotional/communication barriers or cognitive limitation. Patient is motivated to learn. Patient understands and agrees with treatment goals and plan. PT explains patient will be examined in standing, sitting, and lying down to see how their muscles and joints  work. When they are ready, they will be asked to remove their underwear so PT can examine their perineum. The patient is also given the option of providing their own chaperone as one is not provided in our facility. The patient also has the right and is explained the right to defer or refuse any part of the evaluation or treatment including the internal exam. With the patient's consent, PT will use one gloved finger to gently assess the muscles of the pelvic floor, seeing how well it contracts and relaxes and if there is muscle symmetry. After, the patient will get dressed and PT and patient will discuss exam findings and plan of care. PT and patient discuss plan of care, schedule, attendance policy and HEP activities.   01/12/24: patient is not comfortable with therapist performing manual work on the vaginal area and prefers to be educated and done at home.  PELVIC MMT:   MMT eval  Vaginal 2/5, 9s, 4 reps  Internal Anal Sphincter   External Anal Sphincter   Puborectalis   Diastasis Recti See above  (Blank rows = not tested)        TONE: Tension noted throughout  PROLAPSE: Not seen in hooklying with cough  TODAY'S TREATMENT:                                                                                                                              DATE:   01/17/24: Towel roll - sitting for improved awareness and coordination of pelvic floor with diaphragmatic breathing x10  Seated on yoga ball -  X10 pelvic circles, pelvic tilts x10, sways x10 3# punches 2x10 each 3# OHP 2x10 3# held with both hands 2x10 chest press  Hooklying opposite hand/knee ball press 2x10 Hooklying ball squeezes with exhale 2x10   01/12/24 Neuromuscular re-education: Core facilitation: Sitting on ball alternate shoulder flexion holding 2 # 20 x Down training: Sitting on ball performing diaphragmatic breathing and feel the pelvic floor relax Sitting on ball with pelvic circles, diagonals, anterior and  posterior tilt, pelvic sway Sitting on ball with alternate shoulder flexion with 2# each hand Sitting on ball and punch 2 # wt. Forward 20 x Sitting knee extension 20 x  Therapeutic activities: Functional strengthening activities: Educated patient on how to sit on commode and relax the pelvic floor to push urine out and double void technique Self-care: Educated patient on vaginal moisturizers, how to apply them, how to massage the lichens and when to apply the moisturizer Educated patient on how to massage her own pelvic floor, clitoris, mons pubis to work on  urgency and restricted tissue     01/10/24 Neuromuscular:  Reviewed the urge to void to help her with her urgency  Educated patient on the [facility] with coughing and sneezing and keeping the distance between the rib cage and pubic bone Sitting on ball performing diaphragmatic breathing and feel the pelvic floor relax Manual: Soft tissue mobilization: Manual work to the diaphragm to work on diaphragmatic breathing Scar tissue mobilization: Manual work to the scar along the lower abdomen to work on tissue mobility Myofascial release: Fascial release around the areas of the ureters and urachus ligament Exercises: Stretches/mobility: Seated piriformis stretch holding 30 sec each Hamstring stretch with leg extended in sitting holding 30 sec each Seated hip adductor stretch holding 30 sec each side    11/25/23 EVAL Examination completed, findings reviewed, pt educated on POC, HEP, and pelvic floor anatomy, proper bladder voiding times and habits, voiding mechanics and pelvic floor PT role in care, urge drill. Pt motivated to participate in PT and agreeable to attempt recommendations.     PATIENT EDUCATION:  01/12/24 Education details: [email or web address], urge dirll , education on vaginal moisturizers, education on urinating Person educated: Patient Education method: Explanation, Demonstration, Tactile cues, Verbal cues, and Handouts Education  comprehension: verbalized understanding, returned demonstration, verbal cues required, tactile cues required, and needs further education  HOME EXERCISE PROGRAM: 01/12/24 Access Code: [email or web address] URL: [email or web address] Date: 01/12/2024 Prepared by: Channing Pereyra  Exercises - Seated Diaphragmatic Breathing  - 1 x daily - 7 x weekly - 2 sets - 10 reps - Seated Pelvic Floor Contraction  - 1 x daily - 7 x weekly - 2 sets - 10 reps - Seated Cough with Pelvic Floor Contraction and Hand to Mouth  - 1 x daily - 7 x weekly - 2 sets - 10 reps - pelvic floor contract and holds  - 1 x daily - 7 x weekly - 2 sets - 10 reps - 10s holds - Seated Piriformis Stretch with Trunk Bend  - 1 x daily - 7 x weekly - 1 sets - 2 reps - 30 s hold - Seated Hamstring Stretch  - 1 x daily - 7 x weekly - 1 sets - 2 reps - 30 s hold - Seated Hip Adductor Stretch on Swiss Ball  - 1 x daily - 7 x weekly - 1 sets - 2 reps - 30 s hold - Alternating Shoulder Flexion Seated on Swiss Ball  - 1 x daily - 2 x weekly - 2 sets - 10 reps - Seated Lateral Pelvic Tilt on Swiss Ball  - 1 x daily - 2 x weekly - 1 sets - 10 reps - Seated Swiss Ball Pelvic Circles  - 1 x daily - 2 x weekly - 1 sets - 10 reps - Seated Diagonals With Medicine Ball on [employer]  - 1 x daily - 2 x weekly - 1 sets - 10 reps - Swiss Ball Knee Extension  - 1 x daily - 2 x weekly - 10 reps  ASSESSMENT:  CLINICAL IMPRESSION: Patient is a 73 y.o. female  who was seen today for physical therapy  treatment for urinary incontinence with urge and stressors. Patient understands how to perform manual work to her scar. She was able to feel the pelvic floor relax when sitting on the ball.  She is feeling the pelvic floor and abdominals contract with the exercises. She is getting a ball because she likes to exercise on it. Pt would benefit from additional PT to  further address deficits.    OBJECTIVE IMPAIRMENTS: decreased activity tolerance, decreased  coordination, decreased endurance, decreased mobility, difficulty walking, decreased strength, increased fascial restrictions, increased muscle spasms, impaired flexibility, improper body mechanics, postural dysfunction, and pain.   ACTIVITY LIMITATIONS: carrying, lifting, squatting, continence, and locomotion level  PARTICIPATION LIMITATIONS: community activity and working out  PERSONAL FACTORS: Time since onset of injury/illness/exacerbation and 1 comorbidity: medical history are also affecting patient's functional outcome.   REHAB POTENTIAL: Good  CLINICAL DECISION MAKING: Stable/uncomplicated  EVALUATION COMPLEXITY: Low   GOALS: Goals reviewed with patient? Yes  SHORT TERM GOALS: Target date: 12/23/23 01/17/24 - goals updated  Pt to be I with HEP for carry over and continuing recommendations for improved outcomes.   Baseline: Goal status: MET   2.  Pt will be independent with the knack, urge suppression technique, and double voiding in order to improve bladder habits and decrease urinary incontinence.   Baseline:  Goal status: MET  3.  Pt to demonstrate improved coordination of pelvic floor and breathing mechanics with body weight squat with appropriate synergistic patterns to decrease pain and leakage at least 50% of the time for improved ability to complete a  workout without strain at pelvic floor and symptoms.    Baseline:  Goal status: MET   LONG TERM GOALS: Target date: 02/24/24  Pt to be I with advanced HEP for carry over and continuing recommendations for improved outcomes.   Baseline:  Goal status: INITIAL  2.  Pt to demonstrate improved coordination of pelvic floor and breathing mechanics with 10# sit to stands  with appropriate synergistic patterns to decrease pain and leakage at least 75% of the time for improved ability to complete a  workout without strain at pelvic floor and symptoms.    Baseline:  Goal status: INITIAL  3.  Pt to report no more than 1  instance of urinary incontinence in a week for improved confidence with community outings.  Baseline:  Goal status: INITIAL  4.  Pt to report improved comfort and decreased dryness at external pelvic floor due to decreased need of pads and possible use of external moisturizer.  Baseline:  Goal status: INITIAL   PLAN:  PT FREQUENCY: 1-2x/week  PT DURATION: 10 sessions  PLANNED INTERVENTIONS: 97110-Therapeutic exercises, 97530- Therapeutic activity, 97112- Neuromuscular re-education, 97535- Self Care, [id number]- Manual therapy, [phone]- Canalith repositioning, [id number]- Aquatic Therapy, [phone] (1-2 muscles), [id number] (3+ muscles)- Dry Needling, Patient/Family education, Taping, Joint mobilization, Spinal mobilization, Scar mobilization, DME instructions, Cryotherapy, Moist heat, and Biofeedback  PLAN FOR NEXT SESSION:   core engagement, see if patient has done her own pelvic floor work around the introitus, mons pubis, and clitoris   Darryle Navy, PT, DPT 11/17/254:16 PM  [facility] [address], [address] [place], [place] [address] Phone # [phone] Fax [phone]

## 2024-01-19 ENCOUNTER — Ambulatory Visit: Admitting: Physical Therapy

## 2024-01-19 ENCOUNTER — Encounter: Payer: Self-pay | Admitting: Physical Therapy

## 2024-01-19 DIAGNOSIS — M6281 Muscle weakness (generalized): Secondary | ICD-10-CM | POA: Diagnosis not present

## 2024-01-19 DIAGNOSIS — R293 Abnormal posture: Secondary | ICD-10-CM

## 2024-01-19 DIAGNOSIS — R279 Unspecified lack of coordination: Secondary | ICD-10-CM

## 2024-01-19 NOTE — Therapy (Signed)
 OUTPATIENT PHYSICAL THERAPY FEMALE PELVIC TREATMENT   Patient Name: Beverly [name] MRN: [id number] DOB:[birth year], 73 y.o., female Today's Date: 01/19/2024  END OF SESSION:  PT End of Session - 01/19/24 1400     Visit Number 5    Date for Recertification  02/24/24    Authorization - Visit Number 5    Progress Note Due on Visit 10    PT Start Time 1400    PT Stop Time 1440    PT Time Calculation (min) 40 min    Activity Tolerance Patient tolerated treatment well    Behavior During Therapy [facility] for tasks assessed/performed           Past Medical History:  Diagnosis Date   Family history of adverse reaction to anesthesia    pt's mother has hx. of post-op nausea   History of Crohn's disease    Ileostomy in place [facility])    Lichen sclerosus    Osteopenia    Past Surgical History:  Procedure Laterality Date   BREAST REDUCTION SURGERY  2011   ORIF ANKLE FRACTURE Right 06/08/2014   Procedure: OPEN REDUCTION INTERNAL FIXATION (ORIF) RIGHT BIMALLEOLAR ANKLE FRACTURE;  Surgeon: Evalene JONETTA Chancy, MD;  Location: [facility];  Service: Orthopedics;  Laterality: Right;   REDUCTION MAMMAPLASTY Bilateral    STRABISMUS SURGERY     X 2   TOTAL COLECTOMY  2004   TUBAL LIGATION     Patient Active Problem List   Diagnosis Date Noted   History of vitamin D  deficiency 07/28/2013   Lichen sclerosus    Status post bilateral breast reduction 02/07/2012    PCP:    Perri Ronal PARAS, MD    REFERRING PROVIDER:    Cathlyn JAYSON Nikki Bobie FORBES, MD    REFERRING DIAG: [phone] (ICD-10-CM) - Mixed incontinence  THERAPY DIAG:  Muscle weakness (generalized)  Unspecified lack of coordination  Abnormal posture  Rationale for Evaluation and Treatment: Rehabilitation  ONSET DATE: at least 5 years  SUBJECTIVE:                                                                                                                                                                                            SUBJECTIVE STATEMENT: Empty the bladder is 75% better from initial eval.  Bought a ball and this has been helpful for relaxation at pelvic floor, is using a moisturizer at vulva.  Urge drill has been pretty successful but not consistent. Techniques for fully emptying bladder going well.    Has ileostomy secondary to Chron's, has had 2 kids. Urinary incontinence with coughing, sneezing, urge, resisted trunk  extension at gym causes urinary incontinence.   Fluid intake: 6-7 glasses of fluids (most coffee)  FUNCTIONAL LIMITATIONS: careful with outings with fear of urinary incontinence   PERTINENT HISTORY:  Medications for current condition: no Osteopenia, Lichen sclerosus, Crohn's disease, TOTAL COLECTOMY,   Sexual abuse: No  DIAGNOSTIC FINDINGS:  Post-void residual: Voiding Cystourethrogram (VCUG):  Ultrasound: PAIN:  Are you having pain? No   PRECAUTIONS: None  RED FLAGS: None   WEIGHT BEARING RESTRICTIONS: No  FALLS:  Has patient fallen in last 6 months? No  OCCUPATION: retired   ACTIVITY LEVEL : increasing since recent retirement, now going to PT for back pain then once discharged now goes to [facility] (2x weekly)  PLOF: Independent  PATIENT GOALS: to have no urinary incontinent,minimize risk of worsening urinary incontinence    BOWEL MOVEMENT: Has ileostomy    URINATION: Pain with urination: No Fully empty bladder: No needs to strain to empty sometimes                                 Post-void dribble: No Stream: Strong and Weak Urgency: No Frequency:during the day not usually but does if she takes lasix                                                           Nocturia: Yes:  1x   Leakage: Urge to void, Walking to the bathroom, Coughing, Sneezing, and Exercise Pads/briefs: Yes: 1 pad if needed  INTERCOURSE:  Not active  PREGNANCY: Vaginal deliveries 2 Tearing  Episiotomy Yes x2 C-section deliveries 0 Currently pregnant  No  PROLAPSE: None   OBJECTIVE:  Note: Objective measures were completed at Evaluation unless otherwise noted.  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)  Lt: 8s bu pelvic drop Sit-up test:1/3   GAIT: WFL  POSTURE: rounded shoulders, forward head, and posterior pelvic tilt   LUMBARAROM/PROM:  A/PROM A/PROM  Eval (% available)  Flexion 75  Extension 75  Right lateral flexion 100  Left lateral flexion 100  Right rotation 75  Left rotation 75   (Blank rows = not tested)  LOWER EXTREMITY ROM:  Bil hamstrings and adductors limited by 25%  LOWER EXTREMITY MMT:  R hip grossly 3+/5 except flexion 3/5; Lt grossly 4/5 except flexion 3+/5 PALPATION:  General: tightness noted in bil thoracic and lumbar paraspinals   Pelvic Alignment: WFL  Abdominal:   Diastasis: No Distortion: No  Breathing: chest but with cues more diaphragmatic breathing and chest combination  Scar tissue: Yes: vertical scaring throughout full length of abdomen at midline from colectomy, no TTP but profound restrictions throughout and worse at thickened areas at middle and bottom of scaring                 External Perineal Exam: redness throughout vulva and around vaginal opening and bil labia  Internal Pelvic Floor: TTP throughout superficial and deep layers bil   Patient confirms identification and approves PT to assess internal pelvic floor and treatment Yes No emotional/communication barriers or cognitive limitation. Patient is motivated to learn. Patient understands and agrees with treatment goals and plan. PT explains patient will be examined in standing, sitting, and lying down to see how their muscles and joints  work. When they are ready, they will be asked to remove their underwear so PT can examine their perineum. The patient is also given the option of providing their own chaperone as one is not provided in our facility. The patient also has the right and is explained the right to defer or refuse any part of the evaluation or treatment including the internal exam. With the patient's consent, PT will use one gloved finger to gently assess the muscles of the pelvic floor, seeing how well it contracts and relaxes and if there is muscle symmetry. After, the patient will get dressed and PT and patient will discuss exam findings and plan of care. PT and patient discuss plan of care, schedule, attendance policy and HEP activities.   01/12/24: patient is not comfortable with therapist performing manual work on the vaginal area and prefers to be educated and done at home.   PELVIC MMT:   MMT eval  Vaginal 2/5, 9s, 4 reps  Internal Anal Sphincter   External Anal Sphincter   Puborectalis   Diastasis Recti See above  (Blank rows = not tested)        TONE: Tension noted throughout  PROLAPSE: Not seen in hooklying with cough  TODAY'S TREATMENT:                                                                                                                              DATE:   01/19/24 Neuromuscular re-education: Down training: Seated on yoga ball -  X10 pelvic circles, pelvic tilts x10, sways x10 Exercises: Strengthening: Standing with foot on slider moving leg into abduction then back with core and pelvic floor engaged 10 x each side Standing with foot on slider moving leg into extension then back with core and pelvic floor engaged 10 x each side Standing holding the red band at bilateral shoulder extension marching with control 10 x 2  Standing wall PLANK with moving feet out then in the hands out and in keep the core contracted 20 x  Self-care: Educated patient on bladder irritants and how they  affect the bladder Reviewed with patient doing the manual work to her pelvic floor and she has not questions and likes the cream    01/17/24: Towel roll - sitting for improved awareness and coordination of pelvic floor with diaphragmatic breathing x10  Seated on yoga ball -  X10 pelvic circles, pelvic tilts x10, sways x10 3# punches 2x10 each 3# OHP 2x10 3# held with both hands 2x10 chest press  Hooklying opposite hand/knee ball press 2x10 Hooklying ball squeezes  with exhale 2x10   01/12/24 Neuromuscular re-education: Core facilitation: Sitting on ball alternate shoulder flexion holding 2 # 20 x Down training: Sitting on ball performing diaphragmatic breathing and feel the pelvic floor relax Sitting on ball with pelvic circles, diagonals, anterior and posterior tilt, pelvic sway Sitting on ball with alternate shoulder flexion with 2# each hand Sitting on ball and punch 2 # wt. Forward 20 x Sitting knee extension 20 x  Therapeutic activities: Functional strengthening activities: Educated patient on how to sit on commode and relax the pelvic floor to push urine out and double void technique Self-care: Educated patient on vaginal moisturizers, how to apply them, how to massage the lichens and when to apply the moisturizer Educated patient on how to massage her own pelvic floor, clitoris, mons pubis to work on urgency and restricted tissue     01/10/24 Neuromuscular:  Reviewed the urge to void to help her with her urgency  Educated patient on the [facility] with coughing and sneezing and keeping the distance between the rib cage and pubic bone Sitting on ball performing diaphragmatic breathing and feel the pelvic floor relax Manual: Soft tissue mobilization: Manual work to the diaphragm to work on diaphragmatic breathing Scar tissue mobilization: Manual work to the scar along the lower abdomen to work on tissue mobility Myofascial release: Fascial release around the areas of the  ureters and urachus ligament Exercises: Stretches/mobility: Seated piriformis stretch holding 30 sec each Hamstring stretch with leg extended in sitting holding 30 sec each Seated hip adductor stretch holding 30 sec each side    11/25/23 EVAL Examination completed, findings reviewed, pt educated on POC, HEP, and pelvic floor anatomy, proper bladder voiding times and habits, voiding mechanics and pelvic floor PT role in care, urge drill. Pt motivated to participate in PT and agreeable to attempt recommendations.     PATIENT EDUCATION:  01/19/24 Education details: [email or web address], urge dirll , education on vaginal moisturizers, education on urinating Person educated: Patient Education method: Explanation, Demonstration, Tactile cues, Verbal cues, and Handouts Education comprehension: verbalized understanding, returned demonstration, verbal cues required, tactile cues required, and needs further education  HOME EXERCISE PROGRAM: 01/19/24 Access Code: [email or web address] URL: [email or web address] Date: 01/19/2024 Prepared by: Channing Pereyra  Program Notes stand with hands on wall and feet away from wall. Alternate moving feet out then in then arms out and in 10 x each. 2 times per week  Exercises - Seated Diaphragmatic Breathing  - 1 x daily - 7 x weekly - 2 sets - 10 reps - Seated Pelvic Floor Contraction  - 1 x daily - 7 x weekly - 2 sets - 10 reps - Seated Cough with Pelvic Floor Contraction and Hand to Mouth  - 1 x daily - 7 x weekly - 2 sets - 10 reps - pelvic floor contract and holds  - 1 x daily - 7 x weekly - 2 sets - 10 reps - 10s holds - Seated Piriformis Stretch with Trunk Bend  - 1 x daily - 7 x weekly - 1 sets - 2 reps - 30 s hold - Seated Hamstring Stretch  - 1 x daily - 7 x weekly - 1 sets - 2 reps - 30 s hold - Seated Hip Adductor Stretch on Swiss Ball  - 1 x daily - 7 x weekly - 1 sets - 2 reps - 30 s hold - Alternating Shoulder Flexion Seated on Swiss Ball  - 1 x daily - 2 x  weekly - 2 sets -  10 reps - Seated Lateral Pelvic Tilt on Swiss Ball  - 1 x daily - 2 x weekly - 1 sets - 10 reps - Seated Swiss Ball Pelvic Circles  - 1 x daily - 2 x weekly - 1 sets - 10 reps - Seated Diagonals With [employer] on [employer]  - 1 x daily - 2 x weekly - 1 sets - 10 reps - Swiss Ball Knee Extension  - 1 x daily - 2 x weekly - 10 reps - Standing Hip Extension with Counter Support  - 1 x daily - 2 x weekly - 1 sets - 10 reps - Standing Hip Abduction with Counter Support  - 1 x daily - 2 x weekly - 1 sets - 10 reps - Resistance Pulldown with March  - 1 x daily - 2 x weekly - 2 sets - 10 reps  ASSESSMENT:  CLINICAL IMPRESSION: Patient is a 73 y.o. female  who was seen today for physical therapy  treatment for urinary incontinence with urge and stressors.  Empty the bladder is 75% better from initial eval. Urinary leakage when out of home is 33% better.  Patient is working on her core, hip and pelvic floor strength in standing to reduce her urinary leakage. She is needing verbal cues to keep her core steady with her exercises. Pt would benefit from additional PT to further address deficits.    OBJECTIVE IMPAIRMENTS: decreased activity tolerance, decreased coordination, decreased endurance, decreased mobility, difficulty walking, decreased strength, increased fascial restrictions, increased muscle spasms, impaired flexibility, improper body mechanics, postural dysfunction, and pain.   ACTIVITY LIMITATIONS: carrying, lifting, squatting, continence, and locomotion level  PARTICIPATION LIMITATIONS: community activity and working out  PERSONAL FACTORS: Time since onset of injury/illness/exacerbation and 1 comorbidity: medical history are also affecting patient's functional outcome.   REHAB POTENTIAL: Good  CLINICAL DECISION MAKING: Stable/uncomplicated  EVALUATION COMPLEXITY: Low   GOALS: Goals reviewed with patient? Yes  SHORT TERM GOALS: Target date: 12/23/23  Pt to be I  with HEP for carry over and continuing recommendations for improved outcomes.   Baseline: Goal status: MET 01/17/24   2.  Pt will be independent with the knack, urge suppression technique, and double voiding in order to improve bladder habits and decrease urinary incontinence.   Baseline:  Goal status: MET 01/17/24  3.  Pt to demonstrate improved coordination of pelvic floor and breathing mechanics with body weight squat with appropriate synergistic patterns to decrease pain and leakage at least 50% of the time for improved ability to complete a  workout without strain at pelvic floor and symptoms.    Baseline:  Goal status: MET 01/17/24   LONG TERM GOALS: Target date: 02/24/24  Pt to be I with advanced HEP for carry over and continuing recommendations for improved outcomes.   Baseline:  Goal status: INITIAL  2.  Pt to demonstrate improved coordination of pelvic floor and breathing mechanics with 10# sit to stands  with appropriate synergistic patterns to decrease pain and leakage at least 75% of the time for improved ability to complete a  workout without strain at pelvic floor and symptoms.    Baseline:  Goal status: INITIAL  3.  Pt to report no more than 1 instance of urinary incontinence in a week for improved confidence with community outings.  Baseline:  Goal status: INITIAL  4.  Pt to report improved comfort and decreased dryness at external pelvic floor due to decreased need of pads and possible use of  external moisturizer.  Baseline:  Goal status: INITIAL   PLAN:  PT FREQUENCY: 1-2x/week  PT DURATION: 10 sessions  PLANNED INTERVENTIONS: 97110-Therapeutic exercises, 97530- Therapeutic activity, 97112- Neuromuscular re-education, 97535- Self Care, [id number]- Manual therapy, [phone]- Canalith repositioning, [id number]- Aquatic Therapy, [phone] (1-2 muscles), [phone] (3+ muscles)- Dry Needling, Patient/Family education, Taping, Joint mobilization, Spinal mobilization, Scar mobilization,  DME instructions, Cryotherapy, Moist heat, and Biofeedback  PLAN FOR NEXT SESSION:   core engagement, see how the bladder irritants are, sit to stand with weight  Channing Pereyra, PT 01/19/24 2:46 PM  [facility] [address], [address] [place], [place] [address] Phone # [phone] Fax [phone]

## 2024-01-19 NOTE — Patient Instructions (Signed)
Bladder Irritants  Certain foods and beverages can be irritating to the bladder.  Avoiding these irritants may decrease your symptoms of urinary urgency, frequency or bladder pain.  Even reducing your intake can help with your symptoms.  Not everyone is sensitive to all bladder irritants, so you may consider focusing on one irritant at a time, removing or reducing your intake of that irritant for 7-10 days to see if this change helps your symptoms.  Water intake is also very important.  Below is a list of bladder irritants.  Drinks: alcohol, carbonated beverages, caffeinated beverages such as coffee and tea, drinks with artificial sweeteners, citrus juices, apple juice, tomato juice  Foods: tomatoes and tomato based foods, spicy food, sugar and artificial sweeteners, vinegar, chocolate, raw onion, apples, citrus fruits, pineapple, cranberries, tomatoes, strawberries, plums, peaches, cantaloupe  Other: acidic urine (too concentrated) - see water intake info below  Substitutes you can try that are NOT irritating to the bladder: cooked onion, pears, papayas, sun-brewed decaf teas, watermelons, non-citrus herbal teas, apricots, kava and low-acid instant drinks (Postum).    WATER INTAKE: Remember to drink lots of water (aim for fluid intake of half your body weight with 2/3 of fluids being water).  You may be limiting fluids due to fear of leakage, but this can actually worsen urgency symptoms due to highly concentrated urine.  Water helps balance the pH of your urine so it doesn't become too acidic - acidic urine is a bladder irritant  [facility] [address] [place], [place] [address] Phone # [phone] Fax [phone]

## 2024-01-24 ENCOUNTER — Encounter: Payer: Self-pay | Admitting: Physical Therapy

## 2024-01-24 ENCOUNTER — Ambulatory Visit: Admitting: Physical Therapy

## 2024-01-24 DIAGNOSIS — R279 Unspecified lack of coordination: Secondary | ICD-10-CM

## 2024-01-24 DIAGNOSIS — M6281 Muscle weakness (generalized): Secondary | ICD-10-CM | POA: Diagnosis not present

## 2024-01-24 DIAGNOSIS — R293 Abnormal posture: Secondary | ICD-10-CM

## 2024-01-24 NOTE — Therapy (Signed)
 OUTPATIENT PHYSICAL THERAPY FEMALE PELVIC TREATMENT   Patient Name: Beverly [name] MRN: [id number] DOB:[birth year], 73 y.o., female Today's Date: 01/24/2024  END OF SESSION:  PT End of Session - 01/24/24 1402     Visit Number 6    Date for Recertification  02/24/24    Authorization - Visit Number 6    Progress Note Due on Visit 10    PT Start Time 1400    PT Stop Time 1440    PT Time Calculation (min) 40 min    Activity Tolerance Patient tolerated treatment well    Behavior During Therapy [facility] for tasks assessed/performed           Past Medical History:  Diagnosis Date   Family history of adverse reaction to anesthesia    pt's mother has hx. of post-op nausea   History of Crohn's disease    Ileostomy in place [facility])    Lichen sclerosus    Osteopenia    Past Surgical History:  Procedure Laterality Date   BREAST REDUCTION SURGERY  2011   ORIF ANKLE FRACTURE Right 06/08/2014   Procedure: OPEN REDUCTION INTERNAL FIXATION (ORIF) RIGHT BIMALLEOLAR ANKLE FRACTURE;  Surgeon: Evalene JONETTA Chancy, MD;  Location: [facility];  Service: Orthopedics;  Laterality: Right;   REDUCTION MAMMAPLASTY Bilateral    STRABISMUS SURGERY     X 2   TOTAL COLECTOMY  2004   TUBAL LIGATION     Patient Active Problem List   Diagnosis Date Noted   History of vitamin D  deficiency 07/28/2013   Lichen sclerosus    Status post bilateral breast reduction 02/07/2012    PCP:    Perri Ronal PARAS, MD    REFERRING PROVIDER:    Cathlyn JAYSON Nikki Bobie FORBES, MD    REFERRING DIAG: [id number] (ICD-10-CM) - Mixed incontinence  THERAPY DIAG:  Muscle weakness (generalized)  Unspecified lack of coordination  Abnormal posture  Rationale for Evaluation and Treatment: Rehabilitation  ONSET DATE: at least 5 years  SUBJECTIVE:                                                                                                                                                                                            SUBJECTIVE STATEMENT: I feel like my right knee was aggravated with one of the exercises from therapy.    Has ileostomy secondary to Chron's, has had 2 kids. Urinary incontinence with coughing, sneezing, urge, resisted trunk extension at gym causes urinary incontinence.   Fluid intake: 6-7 glasses of fluids (most coffee)  FUNCTIONAL LIMITATIONS: careful with outings with fear of urinary incontinence   PERTINENT HISTORY:  Medications for current condition: no Osteopenia, Lichen sclerosus, Crohn's disease, TOTAL COLECTOMY,   Sexual abuse: No   PAIN:  Are you having pain? No   PRECAUTIONS: None  RED FLAGS: None   WEIGHT BEARING RESTRICTIONS: No  FALLS:  Has patient fallen in last 6 months? No  OCCUPATION: retired   ACTIVITY LEVEL : increasing since recent retirement, now going to PT for back pain then once discharged now goes to [facility] (2x weekly)  PLOF: Independent  PATIENT GOALS: to have no urinary incontinent,minimize risk of worsening urinary incontinence    BOWEL MOVEMENT: Has ileostomy    URINATION: Pain with urination: No Fully empty bladder: No needs to strain to empty sometimes                                 Post-void dribble: No Stream: Strong and Weak Urgency: No Frequency:during the day not usually but does if she takes lasix                                                           Nocturia: Yes:  1x   Leakage: Urge to void, Walking to the bathroom, Coughing, Sneezing, and Exercise Pads/briefs: Yes: 1 pad if needed  INTERCOURSE:  Not active  PREGNANCY: Vaginal deliveries 2 Tearing  Episiotomy Yes x2 C-section deliveries 0 Currently pregnant No  PROLAPSE: None   OBJECTIVE:  Note: Objective measures were completed at Evaluation unless otherwise noted.  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)  Lt: 8s bu pelvic drop Sit-up test:1/3   GAIT: WFL  POSTURE: rounded shoulders, forward head, and posterior pelvic tilt   LUMBARAROM/PROM:  A/PROM A/PROM  Eval (% available)  Flexion 75  Extension 75  Right lateral flexion 100  Left lateral flexion 100  Right rotation 75  Left rotation 75   (Blank rows = not tested)  LOWER EXTREMITY ROM:  Bil hamstrings and adductors limited by 25%  LOWER EXTREMITY MMT:  R hip grossly 3+/5 except flexion 3/5; Lt grossly 4/5 except flexion 3+/5 PALPATION:  General: tightness noted in bil thoracic and lumbar paraspinals   Pelvic Alignment: WFL  Abdominal:   Diastasis: No Distortion: No  Breathing: chest but with cues more diaphragmatic breathing and chest combination  Scar tissue: Yes: vertical scaring throughout full length of abdomen at midline from colectomy, no TTP but profound restrictions throughout and worse at thickened areas at middle and bottom of scaring                 External Perineal Exam: redness throughout vulva and around vaginal opening and bil labia                              Internal Pelvic Floor: TTP throughout superficial and deep layers bil   Patient confirms identification and approves PT to assess internal pelvic floor and treatment Yes No emotional/communication barriers or cognitive limitation. Patient is motivated to learn. Patient understands and agrees  with treatment goals and plan. PT explains patient will be examined in standing, sitting, and lying down to see how their muscles and joints work. When they are ready, they will be asked to remove their underwear so PT can examine their perineum. The patient is also given the option of providing their own chaperone as one is not provided in our facility. The patient also has the right and is explained the  right to defer or refuse any part of the evaluation or treatment including the internal exam. With the patient's consent, PT will use one gloved finger to gently assess the muscles of the pelvic floor, seeing how well it contracts and relaxes and if there is muscle symmetry. After, the patient will get dressed and PT and patient will discuss exam findings and plan of care. PT and patient discuss plan of care, schedule, attendance policy and HEP activities.   01/12/24: patient is not comfortable with therapist performing manual work on the vaginal area and prefers to be educated and done at home.   PELVIC MMT:   MMT eval  Vaginal 2/5, 9s, 4 reps  Internal Anal Sphincter   External Anal Sphincter   Puborectalis   Diastasis Recti See above  (Blank rows = not tested)        TONE: Tension noted throughout  PROLAPSE: Not seen in hooklying with cough  TODAY'S TREATMENT:                                                                                                                              DATE: 01/24/24 Neuromuscular re-education: Pelvic floor contraction training: Supine hip flexion with core 15 x each side Supine shoulder flexion using 2# alternating 20 x  Supine alternate shoulder flexion and hip flexion holding 2 # wt. 20 x  Supine punch 2 # wt diagonally to work the obliques 20 x each side Standing dead lift with 10 # 20 x  Standing with 2 dot loop around hands and moving in different directions Down training: Seated on yoga ball -  X10 pelvic circles, pelvic tilts x10, sways x10      01/19/24 Neuromuscular re-education: Down training: Seated on yoga ball -  X10 pelvic circles, pelvic tilts x10, sways x10 Exercises: Strengthening: Standing with foot on slider moving leg into abduction then back with core and pelvic floor engaged 10 x each side Standing with foot on slider moving leg into extension then back with core and pelvic floor engaged 10 x each side Standing  holding the red band at bilateral shoulder extension marching with control 10 x 2  Standing wall PLANK with moving feet out then in the hands out and in keep the core contracted 20 x  Self-care: Educated patient on bladder irritants and how they affect the bladder Reviewed with patient doing the manual work to her pelvic floor and she has not questions and likes the cream    01/17/24: Towel roll - sitting  for improved awareness and coordination of pelvic floor with diaphragmatic breathing x10  Seated on yoga ball -  X10 pelvic circles, pelvic tilts x10, sways x10 3# punches 2x10 each 3# OHP 2x10 3# held with both hands 2x10 chest press  Hooklying opposite hand/knee ball press 2x10 Hooklying ball squeezes with exhale 2x10   01/12/24 Neuromuscular re-education: Core facilitation: Sitting on ball alternate shoulder flexion holding 2 # 20 x Down training: Sitting on ball performing diaphragmatic breathing and feel the pelvic floor relax Sitting on ball with pelvic circles, diagonals, anterior and posterior tilt, pelvic sway Sitting on ball with alternate shoulder flexion with 2# each hand Sitting on ball and punch 2 # wt. Forward 20 x Sitting knee extension 20 x  Therapeutic activities: Functional strengthening activities: Educated patient on how to sit on commode and relax the pelvic floor to push urine out and double void technique Self-care: Educated patient on vaginal moisturizers, how to apply them, how to massage the lichens and when to apply the moisturizer Educated patient on how to massage her own pelvic floor, clitoris, mons pubis to work on urgency and restricted tissue     PATIENT EDUCATION:  01/19/24 Education details: [email or web address], urge dirll , education on vaginal moisturizers, education on urinating Person educated: Patient Education method: Explanation, Demonstration, Tactile cues, Verbal cues, and Handouts Education comprehension: verbalized understanding, returned  demonstration, verbal cues required, tactile cues required, and needs further education  HOME EXERCISE PROGRAM: 01/19/24 Access Code: [email or web address] URL: [email or web address] Date: 01/19/2024 Prepared by: Channing Pereyra  Program Notes stand with hands on wall and feet away from wall. Alternate moving feet out then in then arms out and in 10 x each. 2 times per week  Exercises - Seated Diaphragmatic Breathing  - 1 x daily - 7 x weekly - 2 sets - 10 reps - Seated Pelvic Floor Contraction  - 1 x daily - 7 x weekly - 2 sets - 10 reps - Seated Cough with Pelvic Floor Contraction and Hand to Mouth  - 1 x daily - 7 x weekly - 2 sets - 10 reps - pelvic floor contract and holds  - 1 x daily - 7 x weekly - 2 sets - 10 reps - 10s holds - Seated Piriformis Stretch with Trunk Bend  - 1 x daily - 7 x weekly - 1 sets - 2 reps - 30 s hold - Seated Hamstring Stretch  - 1 x daily - 7 x weekly - 1 sets - 2 reps - 30 s hold - Seated Hip Adductor Stretch on Swiss Ball  - 1 x daily - 7 x weekly - 1 sets - 2 reps - 30 s hold - Alternating Shoulder Flexion Seated on Swiss Ball  - 1 x daily - 2 x weekly - 2 sets - 10 reps - Seated Lateral Pelvic Tilt on Swiss Ball  - 1 x daily - 2 x weekly - 1 sets - 10 reps - Seated Swiss Ball Pelvic Circles  - 1 x daily - 2 x weekly - 1 sets - 10 reps - Seated Diagonals With Medicine Ball on [employer]  - 1 x daily - 2 x weekly - 1 sets - 10 reps - Swiss Ball Knee Extension  - 1 x daily - 2 x weekly - 10 reps - Standing Hip Extension with Counter Support  - 1 x daily - 2 x weekly - 1 sets - 10 reps - Standing Hip Abduction with Counter Support  - 1  x daily - 2 x weekly - 1 sets - 10 reps - Resistance Pulldown with March  - 1 x daily - 2 x weekly - 2 sets - 10 reps  ASSESSMENT:  CLINICAL IMPRESSION: Patient is a 73 y.o. female  who was seen today for physical therapy  treatment for urinary incontinence with urge and stressors.  Empty the bladder is 80% better from  initial eval.   Patient has not had urinary leakage since last visit. Patient is working on her core, hip and pelvic floor strength in standing to reduce her urinary leakage.  Pt would benefit from additional PT to further address deficits.    OBJECTIVE IMPAIRMENTS: decreased activity tolerance, decreased coordination, decreased endurance, decreased mobility, difficulty walking, decreased strength, increased fascial restrictions, increased muscle spasms, impaired flexibility, improper body mechanics, postural dysfunction, and pain.   ACTIVITY LIMITATIONS: carrying, lifting, squatting, continence, and locomotion level  PARTICIPATION LIMITATIONS: community activity and working out  PERSONAL FACTORS: Time since onset of injury/illness/exacerbation and 1 comorbidity: medical history are also affecting patient's functional outcome.   REHAB POTENTIAL: Good  CLINICAL DECISION MAKING: Stable/uncomplicated  EVALUATION COMPLEXITY: Low   GOALS: Goals reviewed with patient? Yes  SHORT TERM GOALS: Target date: 12/23/23  Pt to be I with HEP for carry over and continuing recommendations for improved outcomes.   Baseline: Goal status: MET 01/17/24   2.  Pt will be independent with the knack, urge suppression technique, and double voiding in order to improve bladder habits and decrease urinary incontinence.   Baseline:  Goal status: MET 01/17/24  3.  Pt to demonstrate improved coordination of pelvic floor and breathing mechanics with body weight squat with appropriate synergistic patterns to decrease pain and leakage at least 50% of the time for improved ability to complete a  workout without strain at pelvic floor and symptoms.    Baseline:  Goal status: MET 01/17/24   LONG TERM GOALS: Target date: 02/24/24  Pt to be I with advanced HEP for carry over and continuing recommendations for improved outcomes.   Baseline:  Goal status: INITIAL  2.  Pt to demonstrate improved coordination of  pelvic floor and breathing mechanics with 10# sit to stands  with appropriate synergistic patterns to decrease pain and leakage at least 75% of the time for improved ability to complete a  workout without strain at pelvic floor and symptoms.    Baseline:  Goal status: INITIAL  3.  Pt to report no more than 1 instance of urinary incontinence in a week for improved confidence with community outings.  Baseline:  Goal status: INITIAL  4.  Pt to report improved comfort and decreased dryness at external pelvic floor due to decreased need of pads and possible use of external moisturizer.  Baseline:  Goal status: INITIAL   PLAN:  PT FREQUENCY: 1-2x/week  PT DURATION: 10 sessions  PLANNED INTERVENTIONS: 97110-Therapeutic exercises, 97530- Therapeutic activity, 97112- Neuromuscular re-education, 97535- Self Care, [id number]- Manual therapy, [phone]- Canalith repositioning, [id number]- Aquatic Therapy, [phone] (1-2 muscles), [id number] (3+ muscles)- Dry Needling, Patient/Family education, Taping, Joint mobilization, Spinal mobilization, Scar mobilization, DME instructions, Cryotherapy, Moist heat, and Biofeedback  PLAN FOR NEXT SESSION:   core engagement sit to stand with weight  Channing Pereyra, PT 01/24/24 2:45 PM  [facility] [address], [address] [place], [place] [address] Phone # [phone] Fax [phone]

## 2024-01-26 ENCOUNTER — Encounter: Payer: Self-pay | Admitting: Physical Therapy

## 2024-01-26 ENCOUNTER — Ambulatory Visit: Admitting: Physical Therapy

## 2024-01-26 DIAGNOSIS — M6281 Muscle weakness (generalized): Secondary | ICD-10-CM

## 2024-01-26 DIAGNOSIS — R279 Unspecified lack of coordination: Secondary | ICD-10-CM

## 2024-01-26 DIAGNOSIS — R293 Abnormal posture: Secondary | ICD-10-CM

## 2024-01-26 NOTE — Therapy (Signed)
 OUTPATIENT PHYSICAL THERAPY FEMALE PELVIC TREATMENT   Patient Name: Beverly [name] MRN: [id number] DOB:02-Dec-1950, 73 y.o., female Today's Date: 01/26/2024  END OF SESSION:  PT End of Session - 01/26/24 1232     Visit Number 7    Date for Recertification  02/24/24    Authorization Type Medicare    Authorization - Visit Number 7    Progress Note Due on Visit 10    PT Start Time 1230    PT Stop Time 1310    PT Time Calculation (min) 40 min    Activity Tolerance Patient tolerated treatment well    Behavior During Therapy WFL for tasks assessed/performed           Past Medical History:  Diagnosis Date   Family history of adverse reaction to anesthesia    pt's mother has hx. of post-op nausea   History of Crohn's disease    Ileostomy in place [facility])    Lichen sclerosus    Osteopenia    Past Surgical History:  Procedure Laterality Date   BREAST REDUCTION SURGERY  2011   ORIF ANKLE FRACTURE Right 06/08/2014   Procedure: OPEN REDUCTION INTERNAL FIXATION (ORIF) RIGHT BIMALLEOLAR ANKLE FRACTURE;  Surgeon: Evalene JONETTA Chancy, MD;  Location: [facility];  Service: Orthopedics;  Laterality: Right;   REDUCTION MAMMAPLASTY Bilateral    STRABISMUS SURGERY     X 2   TOTAL COLECTOMY  2004   TUBAL LIGATION     Patient Active Problem List   Diagnosis Date Noted   History of vitamin D  deficiency 07/28/2013   Lichen sclerosus    Status post bilateral breast reduction 02/07/2012    PCP:    Perri Ronal PARAS, MD    REFERRING PROVIDER:    Cathlyn JAYSON Nikki Bobie FORBES, MD    REFERRING DIAG: [id number] (ICD-10-CM) - Mixed incontinence  THERAPY DIAG:  Muscle weakness (generalized)  Unspecified lack of coordination  Abnormal posture  Rationale for Evaluation and Treatment: Rehabilitation  ONSET DATE: at least 5 years  SUBJECTIVE:                                                                                                                                                                                            SUBJECTIVE STATEMENT: I felt good after last visit.     Has ileostomy secondary to Chron's, has had 2 kids. Urinary incontinence with coughing, sneezing, urge, resisted trunk extension at gym causes urinary incontinence.   Fluid intake: 6-7 glasses of fluids (most coffee)  FUNCTIONAL LIMITATIONS: careful with outings with fear of urinary incontinence   PERTINENT HISTORY:  Medications  for current condition: no Osteopenia, Lichen sclerosus, Crohn's disease, TOTAL COLECTOMY,   Sexual abuse: No   PAIN:  Are you having pain? No   PRECAUTIONS: None  RED FLAGS: None   WEIGHT BEARING RESTRICTIONS: No  FALLS:  Has patient fallen in last 6 months? No  OCCUPATION: retired   ACTIVITY LEVEL : increasing since recent retirement, now going to PT for back pain then once discharged now goes to [facility] (2x weekly)  PLOF: Independent  PATIENT GOALS: to have no urinary incontinent,minimize risk of worsening urinary incontinence    BOWEL MOVEMENT: Has ileostomy    URINATION: Pain with urination: No Fully empty bladder: No needs to strain to empty sometimes                                 Post-void dribble: No Stream: Strong and Weak Urgency: No Frequency:during the day not usually but does if she takes lasix                                                           Nocturia: Yes:  1x   Leakage: Urge to void, Walking to the bathroom, Coughing, Sneezing, and Exercise Pads/briefs: Yes: 1 pad if needed  INTERCOURSE:  Not active  PREGNANCY: Vaginal deliveries 2 Tearing  Episiotomy Yes x2 C-section deliveries 0 Currently pregnant No  PROLAPSE: None   OBJECTIVE:  Note: Objective measures were completed at Evaluation unless otherwise noted.  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)  Lt: 8s bu pelvic drop Sit-up test:1/3   GAIT: WFL  POSTURE: rounded shoulders, forward head, and posterior pelvic tilt   LUMBARAROM/PROM:  A/PROM A/PROM  Eval (% available)  Flexion 75  Extension 75  Right lateral flexion 100  Left lateral flexion 100  Right rotation 75  Left rotation 75   (Blank rows = not tested)  LOWER EXTREMITY ROM:  Bil hamstrings and adductors limited by 25%  LOWER EXTREMITY MMT:  R hip grossly 3+/5 except flexion 3/5; Lt grossly 4/5 except flexion 3+/5 01/26/24; right hip flexion 5/5 grossley 4/5; left hip strength 5/5 PALPATION:  General: tightness noted in bil thoracic and lumbar paraspinals   Pelvic Alignment: WFL  Abdominal:   Diastasis: No Distortion: No  Breathing: chest but with cues more diaphragmatic breathing and chest combination  Scar tissue: Yes: vertical scaring throughout full length of abdomen at midline from colectomy, no TTP but profound restrictions throughout and worse at thickened areas at middle and bottom of scaring                 External Perineal Exam: redness throughout vulva and around vaginal opening and bil labia                              Internal Pelvic Floor: TTP throughout superficial and deep layers bil   Patient confirms identification and approves PT to assess internal pelvic floor and treatment Yes No emotional/communication barriers or cognitive  limitation. Patient is motivated to learn. Patient understands and agrees with treatment goals and plan. PT explains patient will be examined in standing, sitting, and lying down to see how their muscles and joints work. When they are ready, they will be asked to remove their underwear so PT can examine their perineum. The patient is also given the option of providing their own chaperone as one is not provided in our facility. The patient also has the  right and is explained the right to defer or refuse any part of the evaluation or treatment including the internal exam. With the patient's consent, PT will use one gloved finger to gently assess the muscles of the pelvic floor, seeing how well it contracts and relaxes and if there is muscle symmetry. After, the patient will get dressed and PT and patient will discuss exam findings and plan of care. PT and patient discuss plan of care, schedule, attendance policy and HEP activities.   01/12/24: patient is not comfortable with therapist performing manual work on the vaginal area and prefers to be educated and done at home.   PELVIC MMT:   MMT eval  Vaginal 2/5, 9s, 4 reps  Internal Anal Sphincter   External Anal Sphincter   Puborectalis   Diastasis Recti See above  (Blank rows = not tested)        TONE: Tension noted throughout  PROLAPSE: Not seen in hooklying with cough  TODAY'S TREATMENT:   01/26/24 Exercises: Strengthening: Supine hip flexion with core 15 x each side alternating Supine alternate shoulder flexion and hip flexion holding 2 # wt. 20 x  Supine punch 2 # wt diagonally to work the obliques 20 x each side Sitting holding a 5 # kettle bell moving forward and backward Sitting holding 5 # kettle bell diagonals 2 x 10 each Standing dead lift with 10 # 20 x  Standing PLANK with diagonal hip flexion 20 x  Leaning on counter opposite arm and leg lift 20 x                                                                                                                                DATE: 01/24/24 Neuromuscular re-education: Pelvic floor contraction training: Supine hip flexion with core 15 x each side Supine shoulder flexion using 2# alternating 20 x  Supine alternate shoulder flexion and hip flexion holding 2 # wt. 20 x  Supine punch 2 # wt diagonally to work the obliques 20 x each side Standing dead lift with 10 # 20 x  Standing with 2 dot loop around hands and moving  in different directions Down training: Seated on yoga ball -  X10 pelvic circles, pelvic tilts x10, sways x10      01/19/24 Neuromuscular re-education: Down training: Seated on yoga ball -  X10 pelvic circles, pelvic tilts x10, sways x10 Exercises: Strengthening: Standing with foot on slider moving leg into abduction then back with core and  pelvic floor engaged 10 x each side Standing with foot on slider moving leg into extension then back with core and pelvic floor engaged 10 x each side Standing holding the red band at bilateral shoulder extension marching with control 10 x 2  Standing wall PLANK with moving feet out then in the hands out and in keep the core contracted 20 x  Self-care: Educated patient on bladder irritants and how they affect the bladder Reviewed with patient doing the manual work to her pelvic floor and she has not questions and likes the cream    01/17/24: Towel roll - sitting for improved awareness and coordination of pelvic floor with diaphragmatic breathing x10  Seated on yoga ball -  X10 pelvic circles, pelvic tilts x10, sways x10 3# punches 2x10 each 3# OHP 2x10 3# held with both hands 2x10 chest press  Hooklying opposite hand/knee ball press 2x10 Hooklying ball squeezes with exhale 2x10   01/12/24 Neuromuscular re-education: Core facilitation: Sitting on ball alternate shoulder flexion holding 2 # 20 x Down training: Sitting on ball performing diaphragmatic breathing and feel the pelvic floor relax Sitting on ball with pelvic circles, diagonals, anterior and posterior tilt, pelvic sway Sitting on ball with alternate shoulder flexion with 2# each hand Sitting on ball and punch 2 # wt. Forward 20 x Sitting knee extension 20 x  Therapeutic activities: Functional strengthening activities: Educated patient on how to sit on commode and relax the pelvic floor to push urine out and double void technique Self-care: Educated patient on vaginal  moisturizers, how to apply them, how to massage the lichens and when to apply the moisturizer Educated patient on how to massage her own pelvic floor, clitoris, mons pubis to work on urgency and restricted tissue     PATIENT EDUCATION:  01/19/24 Education details: [email or web address], urge dirll , education on vaginal moisturizers, education on urinating Person educated: Patient Education method: Explanation, Demonstration, Tactile cues, Verbal cues, and Handouts Education comprehension: verbalized understanding, returned demonstration, verbal cues required, tactile cues required, and needs further education  HOME EXERCISE PROGRAM: 01/19/24 Access Code: [email or web address] URL: [email or web address] Date: 01/19/2024 Prepared by: Channing Pereyra  Program Notes stand with hands on wall and feet away from wall. Alternate moving feet out then in then arms out and in 10 x each. 2 times per week  Exercises - Seated Diaphragmatic Breathing  - 1 x daily - 7 x weekly - 2 sets - 10 reps - Seated Pelvic Floor Contraction  - 1 x daily - 7 x weekly - 2 sets - 10 reps - Seated Cough with Pelvic Floor Contraction and Hand to Mouth  - 1 x daily - 7 x weekly - 2 sets - 10 reps - pelvic floor contract and holds  - 1 x daily - 7 x weekly - 2 sets - 10 reps - 10s holds - Seated Piriformis Stretch with Trunk Bend  - 1 x daily - 7 x weekly - 1 sets - 2 reps - 30 s hold - Seated Hamstring Stretch  - 1 x daily - 7 x weekly - 1 sets - 2 reps - 30 s hold - Seated Hip Adductor Stretch on Swiss Ball  - 1 x daily - 7 x weekly - 1 sets - 2 reps - 30 s hold - Alternating Shoulder Flexion Seated on Swiss Ball  - 1 x daily - 2 x weekly - 2 sets - 10 reps - Seated Lateral Pelvic Tilt on Swiss Ball  - 1 x  daily - 2 x weekly - 1 sets - 10 reps - Seated Swiss Ball Pelvic Circles  - 1 x daily - 2 x weekly - 1 sets - 10 reps - Seated Diagonals With [employer] on [employer]  - 1 x daily - 2 x weekly - 1 sets - 10 reps - Swiss Ball  Knee Extension  - 1 x daily - 2 x weekly - 10 reps - Standing Hip Extension with Counter Support  - 1 x daily - 2 x weekly - 1 sets - 10 reps - Standing Hip Abduction with Counter Support  - 1 x daily - 2 x weekly - 1 sets - 10 reps - Resistance Pulldown with March  - 1 x daily - 2 x weekly - 2 sets - 10 reps  ASSESSMENT:  CLINICAL IMPRESSION: Patient is a 73 y.o. female  who was seen today for physical therapy  treatment for urinary incontinence with urge and stressors.  Increased in bilateral hip strength. Patient has more comfort in the vaginal area due to not wearing pads as often and using the vaginal moisturizer. Patient had a tiny bit of urinary leakage since last visit. Patient has pain in right knee making making it difficult to do certain exercises.  Pt would benefit from additional PT to further address deficits.    OBJECTIVE IMPAIRMENTS: decreased activity tolerance, decreased coordination, decreased endurance, decreased mobility, difficulty walking, decreased strength, increased fascial restrictions, increased muscle spasms, impaired flexibility, improper body mechanics, postural dysfunction, and pain.   ACTIVITY LIMITATIONS: carrying, lifting, squatting, continence, and locomotion level  PARTICIPATION LIMITATIONS: community activity and working out  PERSONAL FACTORS: Time since onset of injury/illness/exacerbation and 1 comorbidity: medical history are also affecting patient's functional outcome.   REHAB POTENTIAL: Good  CLINICAL DECISION MAKING: Stable/uncomplicated  EVALUATION COMPLEXITY: Low   GOALS: Goals reviewed with patient? Yes  SHORT TERM GOALS: Target date: 12/23/23  Pt to be I with HEP for carry over and continuing recommendations for improved outcomes.   Baseline: Goal status: MET 01/17/24   2.  Pt will be independent with the knack, urge suppression technique, and double voiding in order to improve bladder habits and decrease urinary incontinence.    Baseline:  Goal status: MET 01/17/24  3.  Pt to demonstrate improved coordination of pelvic floor and breathing mechanics with body weight squat with appropriate synergistic patterns to decrease pain and leakage at least 50% of the time for improved ability to complete a  workout without strain at pelvic floor and symptoms.    Baseline:  Goal status: MET 01/17/24   LONG TERM GOALS: Target date: 02/24/24  Pt to be I with advanced HEP for carry over and continuing recommendations for improved outcomes.   Baseline:  Goal status: INITIAL  2.  Pt to demonstrate improved coordination of pelvic floor and breathing mechanics with 10# sit to stands  with appropriate synergistic patterns to decrease pain and leakage at least 75% of the time for improved ability to complete a  workout without strain at pelvic floor and symptoms.    Baseline:  Goal status: INITIAL  3.  Pt to report no more than 1 instance of urinary incontinence in a week for improved confidence with community outings.  Baseline:  Goal status: INITIAL  4.  Pt to report improved comfort and decreased dryness at external pelvic floor due to decreased need of pads and possible use of external moisturizer.  Baseline:  Goal status: met 01/26/24   PLAN:  PT FREQUENCY: 1-2x/week  PT DURATION: 10 sessions  PLANNED INTERVENTIONS: 97110-Therapeutic exercises, 97530- Therapeutic activity, 97112- Neuromuscular re-education, 97535- Self Care, [id number]- Manual therapy, [phone]- Canalith repositioning, [id number]- Aquatic Therapy, [phone] (1-2 muscles), [phone] (3+ muscles)- Dry Needling, Patient/Family education, Taping, Joint mobilization, Spinal mobilization, Scar mobilization, DME instructions, Cryotherapy, Moist heat, and Biofeedback  PLAN FOR NEXT SESSION:   core engagement sit to stand with weight  Channing Pereyra, PT 01/26/24 1:07 PM  [facility] [address] [place], [place] [address] Phone #  [phone] Fax [phone]

## 2024-01-31 ENCOUNTER — Ambulatory Visit: Attending: Obstetrics and Gynecology | Admitting: Physical Therapy

## 2024-01-31 DIAGNOSIS — M6281 Muscle weakness (generalized): Secondary | ICD-10-CM | POA: Insufficient documentation

## 2024-01-31 DIAGNOSIS — R293 Abnormal posture: Secondary | ICD-10-CM | POA: Diagnosis present

## 2024-01-31 DIAGNOSIS — R279 Unspecified lack of coordination: Secondary | ICD-10-CM | POA: Diagnosis present

## 2024-01-31 NOTE — Therapy (Signed)
 OUTPATIENT PHYSICAL THERAPY FEMALE PELVIC TREATMENT   Patient Name: Beverly [name] MRN: [id number] DOB:01-04-1951, 73 y.o., female Today's Date: 01/31/2024  END OF SESSION:  PT End of Session - 01/31/24 1408     Visit Number 8    Date for Recertification  02/24/24    Authorization Type Medicare    Authorization - Visit Number 8    Progress Note Due on Visit 10    PT Start Time 1407   arrival   PT Stop Time 1445    PT Time Calculation (min) 38 min    Activity Tolerance Patient tolerated treatment well    Behavior During Therapy WFL for tasks assessed/performed            Past Medical History:  Diagnosis Date   Family history of adverse reaction to anesthesia    pt's mother has hx. of post-op nausea   History of Crohn's disease    Ileostomy in place [facility])    Lichen sclerosus    Osteopenia    Past Surgical History:  Procedure Laterality Date   BREAST REDUCTION SURGERY  2011   ORIF ANKLE FRACTURE Right 06/08/2014   Procedure: OPEN REDUCTION INTERNAL FIXATION (ORIF) RIGHT BIMALLEOLAR ANKLE FRACTURE;  Surgeon: Evalene JONETTA Chancy, MD;  Location: [facility];  Service: Orthopedics;  Laterality: Right;   REDUCTION MAMMAPLASTY Bilateral    STRABISMUS SURGERY     X 2   TOTAL COLECTOMY  2004   TUBAL LIGATION     Patient Active Problem List   Diagnosis Date Noted   History of vitamin D  deficiency 07/28/2013   Lichen sclerosus    Status post bilateral breast reduction 02/07/2012    PCP:    Perri Ronal PARAS, MD    REFERRING PROVIDER:    Cathlyn JAYSON Nikki Bobie FORBES, MD    REFERRING DIAG: [phone] (ICD-10-CM) - Mixed incontinence  THERAPY DIAG:  Muscle weakness (generalized)  Unspecified lack of coordination  Abnormal posture  Rationale for Evaluation and Treatment: Rehabilitation  ONSET DATE: at least 5 years  SUBJECTIVE:                                                                                                                                                                                            SUBJECTIVE STATEMENT: Only wearing pads when has to take lasix , keeps one with her just in case but more confident with making it to the bathroom. Only having urinary incontinence a couple times a week and this is usually a smaller amount of urine.    Has ileostomy secondary to Chron's, has had 2 kids. Urinary incontinence with  coughing, sneezing, urge, resisted trunk extension at gym causes urinary incontinence.   Fluid intake: 6-7 glasses of fluids (most coffee)  FUNCTIONAL LIMITATIONS: careful with outings with fear of urinary incontinence   PERTINENT HISTORY:  Medications for current condition: no Osteopenia, Lichen sclerosus, Crohn's disease, TOTAL COLECTOMY,   Sexual abuse: No   PAIN:  Are you having pain? No   PRECAUTIONS: None  RED FLAGS: None   WEIGHT BEARING RESTRICTIONS: No  FALLS:  Has patient fallen in last 6 months? No  OCCUPATION: retired   ACTIVITY LEVEL : increasing since recent retirement, now going to PT for back pain then once discharged now goes to [facility] (2x weekly)  PLOF: Independent  PATIENT GOALS: to have no urinary incontinent,minimize risk of worsening urinary incontinence    BOWEL MOVEMENT: Has ileostomy    URINATION: Pain with urination: No Fully empty bladder: No needs to strain to empty sometimes                                 Post-void dribble: No Stream: Strong and Weak Urgency: No Frequency:during the day not usually but does if she takes lasix                                                           Nocturia: Yes:  1x   Leakage: Urge to void, Walking to the bathroom, Coughing, Sneezing, and Exercise Pads/briefs: Yes: 1 pad if needed  INTERCOURSE:  Not active  PREGNANCY: Vaginal deliveries 2 Tearing  Episiotomy Yes x2 C-section deliveries 0 Currently pregnant No  PROLAPSE: None   OBJECTIVE:  Note: Objective measures were completed at Evaluation unless  otherwise noted.  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)  Lt: 8s bu pelvic drop Sit-up test:1/3   GAIT: WFL  POSTURE: rounded shoulders, forward head, and posterior pelvic tilt   LUMBARAROM/PROM:  A/PROM A/PROM  Eval (% available)  Flexion 75  Extension 75  Right lateral flexion 100  Left lateral flexion 100  Right rotation 75  Left rotation 75   (Blank rows = not tested)  LOWER EXTREMITY ROM:  Bil hamstrings and adductors limited by 25%  LOWER EXTREMITY MMT:  R hip grossly 3+/5 except flexion 3/5; Lt grossly 4/5 except flexion 3+/5 01/26/24; right hip flexion 5/5 grossley 4/5; left hip strength 5/5 PALPATION:  General: tightness noted in bil thoracic and lumbar paraspinals   Pelvic Alignment: WFL  Abdominal:   Diastasis: No Distortion: No  Breathing: chest but with cues more diaphragmatic breathing and chest combination  Scar tissue: Yes: vertical scaring throughout full length of abdomen at midline from colectomy, no TTP but profound restrictions throughout and worse at thickened areas at middle and bottom of scaring                 External Perineal Exam: redness throughout vulva and around vaginal opening and bil labia  Internal Pelvic Floor: TTP throughout superficial and deep layers bil   Patient confirms identification and approves PT to assess internal pelvic floor and treatment Yes No emotional/communication barriers or cognitive limitation. Patient is motivated to learn. Patient understands and agrees with treatment goals and plan. PT explains patient will be examined in standing, sitting, and lying down to see how their muscles and joints work. When they are ready, they  will be asked to remove their underwear so PT can examine their perineum. The patient is also given the option of providing their own chaperone as one is not provided in our facility. The patient also has the right and is explained the right to defer or refuse any part of the evaluation or treatment including the internal exam. With the patient's consent, PT will use one gloved finger to gently assess the muscles of the pelvic floor, seeing how well it contracts and relaxes and if there is muscle symmetry. After, the patient will get dressed and PT and patient will discuss exam findings and plan of care. PT and patient discuss plan of care, schedule, attendance policy and HEP activities.   01/12/24: patient is not comfortable with therapist performing manual work on the vaginal area and prefers to be educated and done at home.   PELVIC MMT:   MMT eval  Vaginal 2/5, 9s, 4 reps  Internal Anal Sphincter   External Anal Sphincter   Puborectalis   Diastasis Recti See above  (Blank rows = not tested)        TONE: Tension noted throughout  PROLAPSE: Not seen in hooklying with cough  TODAY'S TREATMENT:    01/31/24: Seated hip flexion alt with blue band with pelvic floor activation and exhale 2x10 Seated hip abduction alt with blue band with pelvic floor activation and exhale 2x10 Seated hip IR alt with ball squeeze with pelvic floor activation and exhale 2x10 Seated ball squeeze with hands 2x10 with exhale and pelvic floor contraction - reports she can feel contractions much better here 2x10 sit to stands with pelvic floor activation and exhale  X10 6# sit to stands with transverse abdominis activation and exhale   01/26/24 Exercises: Strengthening: Supine hip flexion with core 15 x each side alternating Supine alternate shoulder flexion and hip flexion holding 2 # wt. 20 x  Supine punch 2 # wt diagonally to work the obliques 20 x each side Sitting holding a 5 # kettle bell moving  forward and backward Sitting holding 5 # kettle bell diagonals 2 x 10 each Standing dead lift with 10 # 20 x  Standing PLANK with diagonal hip flexion 20 x  Leaning on counter opposite arm and leg lift 20 x                                                                                                                                DATE: 01/24/24 Neuromuscular re-education: Pelvic floor contraction training: Supine hip flexion  with core 15 x each side Supine shoulder flexion using 2# alternating 20 x  Supine alternate shoulder flexion and hip flexion holding 2 # wt. 20 x  Supine punch 2 # wt diagonally to work the obliques 20 x each side Standing dead lift with 10 # 20 x  Standing with 2 dot loop around hands and moving in different directions Down training: Seated on yoga ball -  X10 pelvic circles, pelvic tilts x10, sways x10      01/19/24 Neuromuscular re-education: Down training: Seated on yoga ball -  X10 pelvic circles, pelvic tilts x10, sways x10 Exercises: Strengthening: Standing with foot on slider moving leg into abduction then back with core and pelvic floor engaged 10 x each side Standing with foot on slider moving leg into extension then back with core and pelvic floor engaged 10 x each side Standing holding the red band at bilateral shoulder extension marching with control 10 x 2  Standing wall PLANK with moving feet out then in the hands out and in keep the core contracted 20 x  Self-care: Educated patient on bladder irritants and how they affect the bladder Reviewed with patient doing the manual work to her pelvic floor and she has not questions and likes the cream    01/17/24: Towel roll - sitting for improved awareness and coordination of pelvic floor with diaphragmatic breathing x10  Seated on yoga ball -  X10 pelvic circles, pelvic tilts x10, sways x10 3# punches 2x10 each 3# OHP 2x10 3# held with both hands 2x10 chest press  Hooklying opposite  hand/knee ball press 2x10 Hooklying ball squeezes with exhale 2x10   01/12/24 Neuromuscular re-education: Core facilitation: Sitting on ball alternate shoulder flexion holding 2 # 20 x Down training: Sitting on ball performing diaphragmatic breathing and feel the pelvic floor relax Sitting on ball with pelvic circles, diagonals, anterior and posterior tilt, pelvic sway Sitting on ball with alternate shoulder flexion with 2# each hand Sitting on ball and punch 2 # wt. Forward 20 x Sitting knee extension 20 x  Therapeutic activities: Functional strengthening activities: Educated patient on how to sit on commode and relax the pelvic floor to push urine out and double void technique Self-care: Educated patient on vaginal moisturizers, how to apply them, how to massage the lichens and when to apply the moisturizer Educated patient on how to massage her own pelvic floor, clitoris, mons pubis to work on urgency and restricted tissue     PATIENT EDUCATION:  01/19/24 Education details: [email or web address], urge dirll , education on vaginal moisturizers, education on urinating Person educated: Patient Education method: Explanation, Demonstration, Tactile cues, Verbal cues, and Handouts Education comprehension: verbalized understanding, returned demonstration, verbal cues required, tactile cues required, and needs further education  HOME EXERCISE PROGRAM: 01/19/24 Access Code: [email or web address] URL: [email or web address] Date: 01/19/2024 Prepared by: Channing Pereyra  Program Notes stand with hands on wall and feet away from wall. Alternate moving feet out then in then arms out and in 10 x each. 2 times per week  Exercises - Seated Diaphragmatic Breathing  - 1 x daily - 7 x weekly - 2 sets - 10 reps - Seated Pelvic Floor Contraction  - 1 x daily - 7 x weekly - 2 sets - 10 reps - Seated Cough with Pelvic Floor Contraction and Hand to Mouth  - 1 x daily - 7 x weekly - 2 sets - 10 reps - pelvic  floor contract and holds  - 1 x daily - 7 x  weekly - 2 sets - 10 reps - 10s holds - Seated Piriformis Stretch with Trunk Bend  - 1 x daily - 7 x weekly - 1 sets - 2 reps - 30 s hold - Seated Hamstring Stretch  - 1 x daily - 7 x weekly - 1 sets - 2 reps - 30 s hold - Seated Hip Adductor Stretch on Swiss Ball  - 1 x daily - 7 x weekly - 1 sets - 2 reps - 30 s hold - Alternating Shoulder Flexion Seated on Swiss Ball  - 1 x daily - 2 x weekly - 2 sets - 10 reps - Seated Lateral Pelvic Tilt on Swiss Ball  - 1 x daily - 2 x weekly - 1 sets - 10 reps - Seated Swiss Ball Pelvic Circles  - 1 x daily - 2 x weekly - 1 sets - 10 reps - Seated Diagonals With [employer] on [employer]  - 1 x daily - 2 x weekly - 1 sets - 10 reps - Swiss Ball Knee Extension  - 1 x daily - 2 x weekly - 10 reps - Standing Hip Extension with Counter Support  - 1 x daily - 2 x weekly - 1 sets - 10 reps - Standing Hip Abduction with Counter Support  - 1 x daily - 2 x weekly - 1 sets - 10 reps - Resistance Pulldown with March  - 1 x daily - 2 x weekly - 2 sets - 10 reps  ASSESSMENT:  CLINICAL IMPRESSION: Patient is a 73 y.o. female  who was seen today for physical therapy  treatment for urinary incontinence with urge and stressors.  Tolerated session well reports she is doing much better but still not completely there yet is still having urinary incontinence though much less. Pt did benefit from moderate cues for transverse abdominis activation and coordination overall with exercise.  Pt would benefit from additional PT to further address deficits.    OBJECTIVE IMPAIRMENTS: decreased activity tolerance, decreased coordination, decreased endurance, decreased mobility, difficulty walking, decreased strength, increased fascial restrictions, increased muscle spasms, impaired flexibility, improper body mechanics, postural dysfunction, and pain.   ACTIVITY LIMITATIONS: carrying, lifting, squatting, continence, and locomotion  level  PARTICIPATION LIMITATIONS: community activity and working out  PERSONAL FACTORS: Time since onset of injury/illness/exacerbation and 1 comorbidity: medical history are also affecting patient's functional outcome.   REHAB POTENTIAL: Good  CLINICAL DECISION MAKING: Stable/uncomplicated  EVALUATION COMPLEXITY: Low   GOALS: Goals reviewed with patient? Yes  SHORT TERM GOALS: Target date: 12/23/23  Pt to be I with HEP for carry over and continuing recommendations for improved outcomes.   Baseline: Goal status: MET 01/17/24   2.  Pt will be independent with the knack, urge suppression technique, and double voiding in order to improve bladder habits and decrease urinary incontinence.   Baseline:  Goal status: MET 01/17/24  3.  Pt to demonstrate improved coordination of pelvic floor and breathing mechanics with body weight squat with appropriate synergistic patterns to decrease pain and leakage at least 50% of the time for improved ability to complete a  workout without strain at pelvic floor and symptoms.    Baseline:  Goal status: MET 01/17/24   LONG TERM GOALS: Target date: 02/24/24  Pt to be I with advanced HEP for carry over and continuing recommendations for improved outcomes.   Baseline:  Goal status: on going 01/31/24  2.  Pt to demonstrate improved coordination of pelvic floor and breathing mechanics  with 10# sit to stands  with appropriate synergistic patterns to decrease pain and leakage at least 75% of the time for improved ability to complete a  workout without strain at pelvic floor and symptoms.    Baseline:  Goal status: on going 01/31/24  3.  Pt to report no more than 1 instance of urinary incontinence in a week for improved confidence with community outings.  Baseline:  Goal status: on going 01/31/24  4.  Pt to report improved comfort and decreased dryness at external pelvic floor due to decreased need of pads and possible use of external moisturizer.   Baseline:  Goal status: met 01/26/24   PLAN:  PT FREQUENCY: 1-2x/week  PT DURATION: 10 sessions  PLANNED INTERVENTIONS: 97110-Therapeutic exercises, 97530- Therapeutic activity, 97112- Neuromuscular re-education, 97535- Self Care, [id number]- Manual therapy, [phone]- Canalith repositioning, [id number]- Aquatic Therapy, [phone] (1-2 muscles), [phone] (3+ muscles)- Dry Needling, Patient/Family education, Taping, Joint mobilization, Spinal mobilization, Scar mobilization, DME instructions, Cryotherapy, Moist heat, and Biofeedback  PLAN FOR NEXT SESSION:   core engagement sit to stand with weight  Darryle Navy, PT, DPT 12/01/253:26 PM  [facility] [address] [place], [place] [address] Phone # [phone] Fax [phone]

## 2024-02-02 ENCOUNTER — Encounter: Payer: Self-pay | Admitting: Physical Therapy

## 2024-02-02 ENCOUNTER — Ambulatory Visit: Admitting: Physical Therapy

## 2024-02-02 DIAGNOSIS — R279 Unspecified lack of coordination: Secondary | ICD-10-CM

## 2024-02-02 DIAGNOSIS — R293 Abnormal posture: Secondary | ICD-10-CM

## 2024-02-02 DIAGNOSIS — M6281 Muscle weakness (generalized): Secondary | ICD-10-CM

## 2024-02-02 NOTE — Therapy (Signed)
 OUTPATIENT PHYSICAL THERAPY FEMALE PELVIC TREATMENT   Patient Name: Beverly [name] MRN: [id number] DOB:1951/01/02, 73 y.o., female Today's Date: 02/02/2024  END OF SESSION:  PT End of Session - 02/02/24 1407     Visit Number 9    Date for Recertification  02/24/24    Authorization Type Medicare    Authorization - Visit Number 9    Progress Note Due on Visit 10    PT Start Time 1400    PT Stop Time 1440    PT Time Calculation (min) 40 min    Activity Tolerance Patient tolerated treatment well    Behavior During Therapy WFL for tasks assessed/performed            Past Medical History:  Diagnosis Date   Family history of adverse reaction to anesthesia    pt's mother has hx. of post-op nausea   History of Crohn's disease    Ileostomy in place [facility])    Lichen sclerosus    Osteopenia    Past Surgical History:  Procedure Laterality Date   BREAST REDUCTION SURGERY  2011   ORIF ANKLE FRACTURE Right 06/08/2014   Procedure: OPEN REDUCTION INTERNAL FIXATION (ORIF) RIGHT BIMALLEOLAR ANKLE FRACTURE;  Surgeon: Evalene JONETTA Chancy, MD;  Location: [facility];  Service: Orthopedics;  Laterality: Right;   REDUCTION MAMMAPLASTY Bilateral    STRABISMUS SURGERY     X 2   TOTAL COLECTOMY  2004   TUBAL LIGATION     Patient Active Problem List   Diagnosis Date Noted   History of vitamin D  deficiency 07/28/2013   Lichen sclerosus    Status post bilateral breast reduction 02/07/2012    PCP:    Perri Ronal PARAS, MD    REFERRING PROVIDER:    Cathlyn JAYSON Nikki Bobie FORBES, MD    REFERRING DIAG: [phone] (ICD-10-CM) - Mixed incontinence  THERAPY DIAG:  Muscle weakness (generalized)  Unspecified lack of coordination  Abnormal posture  Rationale for Evaluation and Treatment: Rehabilitation  ONSET DATE: at least 5 years  SUBJECTIVE:                                                                                                                                                                                            SUBJECTIVE STATEMENT: Only wearing pads when has to take lasix , keeps one with her just in case but more confident with making it to the bathroom. Only having urinary incontinence a couple times a week and this is usually a smaller amount of urine.    Has ileostomy secondary to Chron's, has had 2 kids. Urinary incontinence with coughing, sneezing,  urge, resisted trunk extension at gym causes urinary incontinence.   Fluid intake: 6-7 glasses of fluids (most coffee)  FUNCTIONAL LIMITATIONS: careful with outings with fear of urinary incontinence   PERTINENT HISTORY:  Medications for current condition: no Osteopenia, Lichen sclerosus, Crohn's disease, TOTAL COLECTOMY,   Sexual abuse: No   PAIN:  Are you having pain? No   PRECAUTIONS: None  RED FLAGS: None   WEIGHT BEARING RESTRICTIONS: No  FALLS:  Has patient fallen in last 6 months? No  OCCUPATION: retired   ACTIVITY LEVEL : increasing since recent retirement, now going to PT for back pain then once discharged now goes to [facility] (2x weekly)  PLOF: Independent  PATIENT GOALS: to have no urinary incontinent,minimize risk of worsening urinary incontinence    BOWEL MOVEMENT: Has ileostomy    URINATION: Pain with urination: No Fully empty bladder: No needs to strain to empty sometimes                                 Post-void dribble: No Stream: Strong and Weak Urgency: No Frequency:during the day not usually but does if she takes lasix                                                           Nocturia: Yes:  1x   Leakage: Urge to void, Walking to the bathroom, Coughing, Sneezing, and Exercise Pads/briefs: Yes: 1 pad if needed  INTERCOURSE:  Not active  PREGNANCY: Vaginal deliveries 2 Tearing  Episiotomy Yes x2 C-section deliveries 0 Currently pregnant No  PROLAPSE: None   OBJECTIVE:  Note: Objective measures were completed at Evaluation unless otherwise  noted.  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)  Lt: 8s bu pelvic drop Sit-up test:1/3   GAIT: WFL  POSTURE: rounded shoulders, forward head, and posterior pelvic tilt   LUMBARAROM/PROM:  A/PROM A/PROM  Eval (% available)  Flexion 75  Extension 75  Right lateral flexion 100  Left lateral flexion 100  Right rotation 75  Left rotation 75   (Blank rows = not tested)  LOWER EXTREMITY ROM:  Bil hamstrings and adductors limited by 25%  LOWER EXTREMITY MMT:  R hip grossly 3+/5 except flexion 3/5; Lt grossly 4/5 except flexion 3+/5 01/26/24; right hip flexion 5/5 grossley 4/5; left hip strength 5/5 PALPATION:  General: tightness noted in bil thoracic and lumbar paraspinals   Pelvic Alignment: WFL  Abdominal:   Diastasis: No Distortion: No  Breathing: chest but with cues more diaphragmatic breathing and chest combination  Scar tissue: Yes: vertical scaring throughout full length of abdomen at midline from colectomy, no TTP but profound restrictions throughout and worse at thickened areas at middle and bottom of scaring                 External Perineal Exam: redness throughout vulva and around vaginal opening and bil labia  Internal Pelvic Floor: TTP throughout superficial and deep layers bil   Patient confirms identification and approves PT to assess internal pelvic floor and treatment Yes No emotional/communication barriers or cognitive limitation. Patient is motivated to learn. Patient understands and agrees with treatment goals and plan. PT explains patient will be examined in standing, sitting, and lying down to see how their muscles and joints work. When they are ready, they will be  asked to remove their underwear so PT can examine their perineum. The patient is also given the option of providing their own chaperone as one is not provided in our facility. The patient also has the right and is explained the right to defer or refuse any part of the evaluation or treatment including the internal exam. With the patient's consent, PT will use one gloved finger to gently assess the muscles of the pelvic floor, seeing how well it contracts and relaxes and if there is muscle symmetry. After, the patient will get dressed and PT and patient will discuss exam findings and plan of care. PT and patient discuss plan of care, schedule, attendance policy and HEP activities.   01/12/24: patient is not comfortable with therapist performing manual work on the vaginal area and prefers to be educated and done at home.   PELVIC MMT:   MMT eval  Vaginal 2/5, 9s, 4 reps  Internal Anal Sphincter   External Anal Sphincter   Puborectalis   Diastasis Recti See above  (Blank rows = not tested)        TONE: Tension noted throughout  PROLAPSE: Not seen in hooklying with cough  TODAY'S TREATMENT:   02/02/24 Neuromuscular re-education: Pelvic floor contraction training: Sit on physioball -pelvic sway, pelvic circles, pelvic tilts, pelvic diagonals Sit to stand with holding 6 # with verbal cues to hinge at hips, keep distance between the rib cage and pubic bone, contract the pelvic floor 10  Standing dead lift with 10 # 10 x 2 Transverse abdominus with hips on disc and tactile cues to the lower abdomen to work on contraction 10 x Supine with hips on the disc with marching and feeling the transverse abdominus contract 20 x  Supine pulling green band to the side to engage the obliques 15 x each way    01/31/24: Seated hip flexion alt with blue band with pelvic floor activation and exhale 2x10 Seated hip abduction alt with blue band with pelvic floor activation and exhale 2x10 Seated hip IR alt  with ball squeeze with pelvic floor activation and exhale 2x10 Seated ball squeeze with hands 2x10 with exhale and pelvic floor contraction - reports she can feel contractions much better here 2x10 sit to stands with pelvic floor activation and exhale  X10 6# sit to stands with transverse abdominis activation and exhale   01/26/24 Exercises: Strengthening: Supine hip flexion with core 15 x each side alternating Supine alternate shoulder flexion and hip flexion holding 2 # wt. 20 x  Supine punch 2 # wt diagonally to work the obliques 20 x each side Sitting holding a 5 # kettle bell moving forward and backward Sitting holding 5 # kettle bell diagonals 2 x 10 each Standing dead lift with 10 # 20 x  Standing PLANK with diagonal hip flexion 20 x  Leaning on counter opposite arm and leg lift 20 x       PATIENT EDUCATION:  01/19/24 Education details: [email or web address], urge dirll , education on vaginal moisturizers, education on urinating Person educated: Patient Education method: Explanation,  Demonstration, Tactile cues, Verbal cues, and Handouts Education comprehension: verbalized understanding, returned demonstration, verbal cues required, tactile cues required, and needs further education  HOME EXERCISE PROGRAM: 01/19/24 Access Code: [email or web address] URL: [email or web address] Date: 01/19/2024 Prepared by: Channing Pereyra  Program Notes stand with hands on wall and feet away from wall. Alternate moving feet out then in then arms out and in 10 x each. 2 times per week  Exercises - Seated Diaphragmatic Breathing  - 1 x daily - 7 x weekly - 2 sets - 10 reps - Seated Pelvic Floor Contraction  - 1 x daily - 7 x weekly - 2 sets - 10 reps - Seated Cough with Pelvic Floor Contraction and Hand to Mouth  - 1 x daily - 7 x weekly - 2 sets - 10 reps - pelvic floor contract and holds  - 1 x daily - 7 x weekly - 2 sets - 10 reps - 10s holds - Seated Piriformis Stretch with Trunk Bend  - 1 x daily -  7 x weekly - 1 sets - 2 reps - 30 s hold - Seated Hamstring Stretch  - 1 x daily - 7 x weekly - 1 sets - 2 reps - 30 s hold - Seated Hip Adductor Stretch on Swiss Ball  - 1 x daily - 7 x weekly - 1 sets - 2 reps - 30 s hold - Alternating Shoulder Flexion Seated on Swiss Ball  - 1 x daily - 2 x weekly - 2 sets - 10 reps - Seated Lateral Pelvic Tilt on Swiss Ball  - 1 x daily - 2 x weekly - 1 sets - 10 reps - Seated Swiss Ball Pelvic Circles  - 1 x daily - 2 x weekly - 1 sets - 10 reps - Seated Diagonals With Medicine [employer] on [employer]  - 1 x daily - 2 x weekly - 1 sets - 10 reps - Swiss Ball Knee Extension  - 1 x daily - 2 x weekly - 10 reps - Standing Hip Extension with Counter Support  - 1 x daily - 2 x weekly - 1 sets - 10 reps - Standing Hip Abduction with Counter Support  - 1 x daily - 2 x weekly - 1 sets - 10 reps - Resistance Pulldown with March  - 1 x daily - 2 x weekly - 2 sets - 10 reps  ASSESSMENT:  CLINICAL IMPRESSION: Patient is a 73 y.o. female  who was seen today for physical therapy  treatment for urinary incontinence with urge and stressors.  Patient has not had urinary leakage since last visit. Patient was able to do sit to stand holding 6 # with keeping the rib cage from the pubic bone. She is now feeling the deep core muscles contract with her exercises.  Pt would benefit from additional PT to further address deficits.    OBJECTIVE IMPAIRMENTS: decreased activity tolerance, decreased coordination, decreased endurance, decreased mobility, difficulty walking, decreased strength, increased fascial restrictions, increased muscle spasms, impaired flexibility, improper body mechanics, postural dysfunction, and pain.   ACTIVITY LIMITATIONS: carrying, lifting, squatting, continence, and locomotion level  PARTICIPATION LIMITATIONS: community activity and working out  PERSONAL FACTORS: Time since onset of injury/illness/exacerbation and 1 comorbidity: medical history are also  affecting patient's functional outcome.   REHAB POTENTIAL: Good  CLINICAL DECISION MAKING: Stable/uncomplicated  EVALUATION COMPLEXITY: Low   GOALS: Goals reviewed with patient? Yes  SHORT TERM GOALS: Target date: 12/23/23  Pt to be I with HEP for  carry over and continuing recommendations for improved outcomes.   Baseline: Goal status: MET 01/17/24   2.  Pt will be independent with the knack, urge suppression technique, and double voiding in order to improve bladder habits and decrease urinary incontinence.   Baseline:  Goal status: MET 01/17/24  3.  Pt to demonstrate improved coordination of pelvic floor and breathing mechanics with body weight squat with appropriate synergistic patterns to decrease pain and leakage at least 50% of the time for improved ability to complete a  workout without strain at pelvic floor and symptoms.    Baseline:  Goal status: MET 01/17/24   LONG TERM GOALS: Target date: 02/24/24  Pt to be I with advanced HEP for carry over and continuing recommendations for improved outcomes.   Baseline:  Goal status: on going 01/31/24  2.  Pt to demonstrate improved coordination of pelvic floor and breathing mechanics with 10# sit to stands  with appropriate synergistic patterns to decrease pain and leakage at least 75% of the time for improved ability to complete a  workout without strain at pelvic floor and symptoms.    Baseline:  Goal status: on going 01/31/24  3.  Pt to report no more than 1 instance of urinary incontinence in a week for improved confidence with community outings.  Baseline:  Goal status: on going 01/31/24  4.  Pt to report improved comfort and decreased dryness at external pelvic floor due to decreased need of pads and possible use of external moisturizer.  Baseline:  Goal status: met 01/26/24   PLAN:  PT FREQUENCY: 1-2x/week  PT DURATION: 10 sessions  PLANNED INTERVENTIONS: 97110-Therapeutic exercises, 97530- Therapeutic activity,  97112- Neuromuscular re-education, 97535- Self Care, [id number]- Manual therapy, [phone]- Canalith repositioning, [id number]- Aquatic Therapy, [phone] (1-2 muscles), [id number] (3+ muscles)- Dry Needling, Patient/Family education, Taping, Joint mobilization, Spinal mobilization, Scar mobilization, DME instructions, Cryotherapy, Moist heat, and Biofeedback  PLAN FOR NEXT SESSION:   core engagement sit to stand with weight; need renewal to continue or discharge, 10th visit 9/25-12/8  Channing Pereyra, PT 02/02/24 2:43 PM  [facility] [address] [place], [place] [address] Phone # [phone] Fax [phone]

## 2024-02-07 ENCOUNTER — Ambulatory Visit: Admitting: Physical Therapy

## 2024-02-07 ENCOUNTER — Encounter: Payer: Self-pay | Admitting: Physical Therapy

## 2024-02-07 DIAGNOSIS — R293 Abnormal posture: Secondary | ICD-10-CM

## 2024-02-07 DIAGNOSIS — R279 Unspecified lack of coordination: Secondary | ICD-10-CM

## 2024-02-07 DIAGNOSIS — M6281 Muscle weakness (generalized): Secondary | ICD-10-CM | POA: Diagnosis not present

## 2024-02-07 NOTE — Therapy (Signed)
 OUTPATIENT PHYSICAL THERAPY FEMALE PELVIC TREATMENT   Patient Name: Beverly [name] MRN: [id number] DOB:12/07/1950, 73 y.o., female Today's Date: 02/07/2024  Progress Note Reporting Period 11/25/23 to 02/07/24  See note below for Objective Data and Assessment of Progress/Goals.     END OF SESSION:  PT End of Session - 02/07/24 1400     Visit Number 10    Date for Recertification  03/06/24    Authorization Type Medicare    Authorization - Visit Number 10    Progress Note Due on Visit 10    PT Start Time 1400    PT Stop Time 1440    PT Time Calculation (min) 40 min    Activity Tolerance Patient tolerated treatment well    Behavior During Therapy WFL for tasks assessed/performed            Past Medical History:  Diagnosis Date   Family history of adverse reaction to anesthesia    pt's mother has hx. of post-op nausea   History of Crohn's disease    Ileostomy in place [facility])    Lichen sclerosus    Osteopenia    Past Surgical History:  Procedure Laterality Date   BREAST REDUCTION SURGERY  2011   ORIF ANKLE FRACTURE Right 06/08/2014   Procedure: OPEN REDUCTION INTERNAL FIXATION (ORIF) RIGHT BIMALLEOLAR ANKLE FRACTURE;  Surgeon: Evalene JONETTA Chancy, MD;  Location: [facility];  Service: Orthopedics;  Laterality: Right;   REDUCTION MAMMAPLASTY Bilateral    STRABISMUS SURGERY     X 2   TOTAL COLECTOMY  2004   TUBAL LIGATION     Patient Active Problem List   Diagnosis Date Noted   History of vitamin D  deficiency 07/28/2013   Lichen sclerosus    Status post bilateral breast reduction 02/07/2012    PCP:    Perri Ronal PARAS, MD    REFERRING PROVIDER:    Cathlyn JAYSON Nikki Bobie FORBES, MD    REFERRING DIAG: [phone] (ICD-10-CM) - Mixed incontinence  THERAPY DIAG:  Muscle weakness (generalized) - Plan: PT plan of care cert/re-cert  Unspecified lack of coordination - Plan: PT plan of care cert/re-cert  Abnormal posture - Plan: PT plan of care  cert/re-cert  Rationale for Evaluation and Treatment: Rehabilitation  ONSET DATE: at least 5 years  SUBJECTIVE:                                                                                                                                                                                           SUBJECTIVE STATEMENT: I had leakage yesterday and doing something on line and delayed the urge to urinate. Maybe  leaked a tablespoon of urine. I took my lasiks 4 hours prior.   Has ileostomy secondary to Chron's, has had 2 kids. Urinary incontinence with coughing, sneezing, urge, resisted trunk extension at gym causes urinary incontinence.   Fluid intake: 6-7 glasses of fluids (most coffee)  FUNCTIONAL LIMITATIONS: careful with outings with fear of urinary incontinence   PERTINENT HISTORY:  Medications for current condition: no Osteopenia, Lichen sclerosus, Crohn's disease, TOTAL COLECTOMY,   Sexual abuse: No   PAIN:  Are you having pain? No   PRECAUTIONS: None  RED FLAGS: None   WEIGHT BEARING RESTRICTIONS: No  FALLS:  Has patient fallen in last 6 months? No  OCCUPATION: retired   ACTIVITY LEVEL : increasing since recent retirement, now going to PT for back pain then once discharged now goes to [facility] (2x weekly)  PLOF: Independent  PATIENT GOALS: to have no urinary incontinent,minimize risk of worsening urinary incontinence    BOWEL MOVEMENT: Has ileostomy    URINATION: Pain with urination: No Fully empty bladder: No needs to strain to empty sometimes                                 Post-void dribble: No Stream: Strong and Weak Urgency: No Frequency:during the day not usually but does if she takes lasix                                                           Nocturia: Yes:  1x   Leakage: Urge to void, Walking to the bathroom, Coughing, Sneezing, and Exercise Pads/briefs: Yes: 1 pad if needed  INTERCOURSE:  Not active  PREGNANCY: Vaginal deliveries  2 Tearing  Episiotomy Yes x2 C-section deliveries 0 Currently pregnant No  PROLAPSE: None   OBJECTIVE:  Note: Objective measures were completed at Evaluation unless otherwise noted.   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)  Lt: 8s bu pelvic drop Sit-up test:1/3   GAIT: WFL  POSTURE: rounded shoulders, forward head, and posterior pelvic tilt   LUMBARAROM/PROM:  A/PROM A/PROM  Eval (% available)  Flexion 75  Extension 75  Right lateral flexion 100  Left lateral flexion 100  Right rotation 75  Left rotation 75   (Blank rows = not tested)  LOWER EXTREMITY ROM:  Bil hamstrings and adductors limited by 25%  LOWER EXTREMITY MMT:  R hip grossly 3+/5 except flexion 3/5; Lt grossly 4/5 except flexion 3+/5 01/26/24; right hip flexion 5/5 grossley 4/5; left hip strength 5/5 PALPATION:  General: tightness noted in bil thoracic and lumbar paraspinals   Pelvic Alignment: WFL  Abdominal:   Diastasis: No Distortion: No  Breathing: chest but with cues more diaphragmatic breathing and chest combination  Scar tissue: Yes: vertical scaring throughout full length of abdomen at midline from colectomy, no TTP but profound restrictions throughout and worse at thickened areas at middle and bottom of scaring                 External Perineal Exam: redness throughout vulva and around vaginal opening and bil labia  Internal Pelvic Floor: TTP throughout superficial and deep layers bil   Patient confirms identification and approves PT to assess internal pelvic floor and treatment Yes No emotional/communication barriers or cognitive limitation. Patient is motivated to learn. Patient understands and agrees with treatment goals and  plan. PT explains patient will be examined in standing, sitting, and lying down to see how their muscles and joints work. When they are ready, they will be asked to remove their underwear so PT can examine their perineum. The patient is also given the option of providing their own chaperone as one is not provided in our facility. The patient also has the right and is explained the right to defer or refuse any part of the evaluation or treatment including the internal exam. With the patient's consent, PT will use one gloved finger to gently assess the muscles of the pelvic floor, seeing how well it contracts and relaxes and if there is muscle symmetry. After, the patient will get dressed and PT and patient will discuss exam findings and plan of care. PT and patient discuss plan of care, schedule, attendance policy and HEP activities.   01/12/24: patient is not comfortable with therapist performing manual work on the vaginal area and prefers to be educated and done at home.   PELVIC MMT:   MMT eval  Vaginal 2/5, 9s, 4 reps  Internal Anal Sphincter   External Anal Sphincter   Puborectalis   Diastasis Recti See above  (Blank rows = not tested)        TONE: Tension noted throughout  PROLAPSE: Not seen in [facility] with cough  TODAY'S TREATMENT:   02/07/24 Neuromuscular re-education: Pelvic floor contraction training: Sit on physioball -pelvic sway, pelvic circles, pelvic tilts, pelvic diagonals Sitting on ball alternate shoulder and hip flexion 20 x  Sitting on ball and pull the green band to the opposite side to work the obliques 20 x each side Sit on ball going from sit to stand with pulling back green band into extension 10 x  Sitting on ball bilateral shoulder retraction with green band 15 x Sitting on higher mat to avoid strain on right knee, holding 10 # leaning forward with trunk and pelvic floor contraction Sitting on high mat holding 10 # wt lean forward to standing with band around  knees to prevent them from going inward 10 x  Side stepping with band around knees holding 10 #     02/02/24 Neuromuscular re-education: Pelvic floor contraction training: Sit on physioball -pelvic sway, pelvic circles, pelvic tilts, pelvic diagonals Sit to stand with holding 6 # with verbal cues to hinge at hips, keep distance between the rib cage and pubic bone, contract the pelvic floor 10  Standing dead lift with 10 # 10 x 2 Transverse abdominus with hips on disc and tactile cues to the lower abdomen to work on contraction 10 x Supine with hips on the disc with marching and feeling the transverse abdominus contract 20 x  Supine pulling green band to the side to engage the obliques 15 x each way    01/31/24: Seated hip flexion alt with blue band with pelvic floor activation and exhale 2x10 Seated hip abduction alt with blue band with pelvic floor activation and exhale 2x10 Seated hip IR alt with ball squeeze with pelvic floor activation and exhale 2x10 Seated ball squeeze with hands 2x10 with exhale and pelvic floor contraction - reports she can feel contractions much better here 2x10 sit to stands with pelvic floor  activation and exhale  X10 6# sit to stands with transverse abdominis activation and exhale   PATIENT EDUCATION:  01/19/24 Education details: [email or web address], urge dirll , education on vaginal moisturizers, education on urinating Person educated: Patient Education method: Explanation, Demonstration, Tactile cues, Verbal cues, and Handouts Education comprehension: verbalized understanding, returned demonstration, verbal cues required, tactile cues required, and needs further education  HOME EXERCISE PROGRAM: 01/19/24 Access Code: [email or web address] URL: [email or web address] Date: 01/19/2024 Prepared by: Channing Pereyra  Program Notes stand with hands on wall and feet away from wall. Alternate moving feet out then in then arms out and in 10 x each. 2 times per  week  Exercises - Seated Diaphragmatic Breathing  - 1 x daily - 7 x weekly - 2 sets - 10 reps - Seated Pelvic Floor Contraction  - 1 x daily - 7 x weekly - 2 sets - 10 reps - Seated Cough with Pelvic Floor Contraction and Hand to Mouth  - 1 x daily - 7 x weekly - 2 sets - 10 reps - pelvic floor contract and holds  - 1 x daily - 7 x weekly - 2 sets - 10 reps - 10s holds - Seated Piriformis Stretch with Trunk Bend  - 1 x daily - 7 x weekly - 1 sets - 2 reps - 30 s hold - Seated Hamstring Stretch  - 1 x daily - 7 x weekly - 1 sets - 2 reps - 30 s hold - Seated Hip Adductor Stretch on Swiss Ball  - 1 x daily - 7 x weekly - 1 sets - 2 reps - 30 s hold - Alternating Shoulder Flexion Seated on Swiss Ball  - 1 x daily - 2 x weekly - 2 sets - 10 reps - Seated Lateral Pelvic Tilt on Swiss Ball  - 1 x daily - 2 x weekly - 1 sets - 10 reps - Seated Swiss Ball Pelvic Circles  - 1 x daily - 2 x weekly - 1 sets - 10 reps - Seated Diagonals With [employer] on [employer]  - 1 x daily - 2 x weekly - 1 sets - 10 reps - Swiss Ball Knee Extension  - 1 x daily - 2 x weekly - 10 reps - Standing Hip Extension with Counter Support  - 1 x daily - 2 x weekly - 1 sets - 10 reps - Standing Hip Abduction with Counter Support  - 1 x daily - 2 x weekly - 1 sets - 10 reps - Resistance Pulldown with March  - 1 x daily - 2 x weekly - 2 sets - 10 reps  ASSESSMENT:  CLINICAL IMPRESSION: Patient is a 73 y.o. female  who was seen today for physical therapy  treatment for urinary incontinence with urge and stressors.  Patient leakage is 80% better. The leakage is not daily.  Patient was able to do sit to stand holding 6 # with keeping the rib cage from the pubic bone. She is now feeling the deep core muscles contract with her exercises.  Patient worked on sit to stand with pelvic floor contraction due to this being a time she will leak. Patent has less leakage when she pays attention to the activity and less leakage. She is able  to feel the pelvic floor relax and contracting. She has increased strength in her hips. Pt would benefit from additional PT to further address deficits.    OBJECTIVE IMPAIRMENTS: decreased activity tolerance, decreased coordination, decreased endurance, decreased mobility, difficulty  walking, decreased strength, increased fascial restrictions, increased muscle spasms, impaired flexibility, improper body mechanics, postural dysfunction, and pain.   ACTIVITY LIMITATIONS: carrying, lifting, squatting, continence, and locomotion level  PARTICIPATION LIMITATIONS: community activity and working out  PERSONAL FACTORS: Time since onset of injury/illness/exacerbation and 1 comorbidity: medical history are also affecting patient's functional outcome.   REHAB POTENTIAL: Good  CLINICAL DECISION MAKING: Stable/uncomplicated  EVALUATION COMPLEXITY: Low   GOALS: Goals reviewed with patient? Yes  SHORT TERM GOALS: Target date: 12/23/23  Pt to be I with HEP for carry over and continuing recommendations for improved outcomes.   Baseline: Goal status: MET 01/17/24   2.  Pt will be independent with the knack, urge suppression technique, and double voiding in order to improve bladder habits and decrease urinary incontinence.   Baseline:  Goal status: MET 01/17/24  3.  Pt to demonstrate improved coordination of pelvic floor and breathing mechanics with body weight squat with appropriate synergistic patterns to decrease pain and leakage at least 50% of the time for improved ability to complete a  workout without strain at pelvic floor and symptoms.    Baseline:  Goal status: MET 01/17/24   LONG TERM GOALS: Target date: 03/06/24  Pt to be I with advanced HEP for carry over and continuing recommendations for improved outcomes.   Baseline:  Goal status: on going 01/31/24  2.  Pt to demonstrate improved coordination of pelvic floor and breathing mechanics with 10# sit to stands  with appropriate  synergistic patterns to decrease pain and leakage at least 75% of the time for improved ability to complete a  workout without strain at pelvic floor and symptoms.    Baseline: leakage is 80% better.  Goal status: on going 01/31/24  3.  Pt to report no more than 1 instance of urinary incontinence in a week for improved confidence with community outings.  Baseline: Patient has had 2 urinary leakages this past week but does happen after taking her water pill.  Goal status: on going 01/31/24  4.  Pt to report improved comfort and decreased dryness at external pelvic floor due to decreased need of pads and possible use of external moisturizer.  Baseline:  Goal status: met 01/26/24   PLAN:  PT FREQUENCY: 1-2x/week  PT DURATION: 4 weeks  PLANNED INTERVENTIONS: 97110-Therapeutic exercises, 97530- Therapeutic activity, 97112- Neuromuscular re-education, 97535- Self Care, [id number]- Manual therapy, [phone]- Canalith repositioning, [id number]- Aquatic Therapy, [phone] (1-2 muscles), [phone] (3+ muscles)- Dry Needling, Patient/Family education, Taping, Joint mobilization, Spinal mobilization, Scar mobilization, DME instructions, Cryotherapy, Moist heat, and Biofeedback  PLAN FOR NEXT SESSION:   core engagement sit to stand with weight  Channing Pereyra, PT 02/07/24 2:44 PM  [facility] [address], [address] [place], [place] [address] Phone # [phone] Fax [phone]

## 2024-02-09 ENCOUNTER — Ambulatory Visit: Admitting: Physical Therapy

## 2024-02-09 ENCOUNTER — Encounter: Payer: Self-pay | Admitting: Physical Therapy

## 2024-02-09 DIAGNOSIS — M6281 Muscle weakness (generalized): Secondary | ICD-10-CM

## 2024-02-09 DIAGNOSIS — R279 Unspecified lack of coordination: Secondary | ICD-10-CM

## 2024-02-09 DIAGNOSIS — R293 Abnormal posture: Secondary | ICD-10-CM

## 2024-02-09 NOTE — Therapy (Signed)
 OUTPATIENT PHYSICAL THERAPY FEMALE PELVIC TREATMENT   Patient Name: Beverly [name] MRN: [id number] DOB:[birth year], 73 y.o., female Today's Date: 02/09/2024  Progress Note Reporting Period 11/25/23 to 02/07/24  See note below for Objective Data and Assessment of Progress/Goals.     END OF SESSION:  PT End of Session - 02/09/24 1402     Visit Number 11    Date for Recertification  03/06/24    Authorization Type Medicare    Authorization - Visit Number 10    Progress Note Due on Visit 10    PT Start Time 1400    PT Stop Time 1440    PT Time Calculation (min) 40 min    Activity Tolerance Patient tolerated treatment well    Behavior During Therapy WFL for tasks assessed/performed            Past Medical History:  Diagnosis Date   Family history of adverse reaction to anesthesia    pt's mother has hx. of post-op nausea   History of Crohn's disease    Ileostomy in place [facility])    Lichen sclerosus    Osteopenia    Past Surgical History:  Procedure Laterality Date   BREAST REDUCTION SURGERY  2011   ORIF ANKLE FRACTURE Right 06/08/2014   Procedure: OPEN REDUCTION INTERNAL FIXATION (ORIF) RIGHT BIMALLEOLAR ANKLE FRACTURE;  Surgeon: Evalene JONETTA Chancy, MD;  Location: [facility];  Service: Orthopedics;  Laterality: Right;   REDUCTION MAMMAPLASTY Bilateral    STRABISMUS SURGERY     X 2   TOTAL COLECTOMY  2004   TUBAL LIGATION     Patient Active Problem List   Diagnosis Date Noted   History of vitamin D  deficiency 07/28/2013   Lichen sclerosus    Status post bilateral breast reduction 02/07/2012    PCP:    Perri Ronal PARAS, MD    REFERRING PROVIDER:    Cathlyn JAYSON Nikki Bobie FORBES, MD    REFERRING DIAG: [phone] (ICD-10-CM) - Mixed incontinence  THERAPY DIAG:  Muscle weakness (generalized)  Unspecified lack of coordination  Abnormal posture  Rationale for Evaluation and Treatment: Rehabilitation  ONSET DATE: at least 5 years  SUBJECTIVE:                                                                                                                                                                                            SUBJECTIVE STATEMENT: I am feeling stronger. No leakage since last visit.  Has ileostomy secondary to Chron's, has had 2 kids. Urinary incontinence with coughing, sneezing, urge, resisted trunk extension at gym causes urinary incontinence.   Fluid intake:  6-7 glasses of fluids (most coffee)  FUNCTIONAL LIMITATIONS: careful with outings with fear of urinary incontinence   PERTINENT HISTORY:  Medications for current condition: no Osteopenia, Lichen sclerosus, Crohn's disease, TOTAL COLECTOMY,   Sexual abuse: No   PAIN:  Are you having pain? No   PRECAUTIONS: None  RED FLAGS: None   WEIGHT BEARING RESTRICTIONS: No  FALLS:  Has patient fallen in last 6 months? No  OCCUPATION: retired   ACTIVITY LEVEL : increasing since recent retirement, now going to PT for back pain then once discharged now goes to [facility] (2x weekly)  PLOF: Independent  PATIENT GOALS: to have no urinary incontinent,minimize risk of worsening urinary incontinence    BOWEL MOVEMENT: Has ileostomy    URINATION: Pain with urination: No Fully empty bladder: No needs to strain to empty sometimes                                 Post-void dribble: No Stream: Strong and Weak Urgency: No Frequency:during the day not usually but does if she takes lasix                                                           Nocturia: Yes:  1x   Leakage: Urge to void, Walking to the bathroom, Coughing, Sneezing, and Exercise Pads/briefs: Yes: 1 pad if needed  INTERCOURSE:  Not active  PREGNANCY: Vaginal deliveries 2 Tearing  Episiotomy Yes x2 C-section deliveries 0 Currently pregnant No  PROLAPSE: None   OBJECTIVE:  Note: Objective measures were completed at Evaluation unless otherwise noted.   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)  Lt: 8s bu pelvic drop Sit-up test:1/3   GAIT: WFL  POSTURE: rounded shoulders, forward head, and posterior pelvic tilt   LUMBARAROM/PROM:  A/PROM A/PROM  Eval (% available)  Flexion 75  Extension 75  Right lateral flexion 100  Left lateral flexion 100  Right rotation 75  Left rotation 75   (Blank rows = not tested)  LOWER EXTREMITY ROM:  Bil hamstrings and adductors limited by 25%  LOWER EXTREMITY MMT:  R hip grossly 3+/5 except flexion 3/5; Lt grossly 4/5 except flexion 3+/5 01/26/24; right hip flexion 5/5 grossley 4/5; left hip strength 5/5 PALPATION:  General: tightness noted in bil thoracic and lumbar paraspinals   Pelvic Alignment: WFL  Abdominal:   Diastasis: No Distortion: No  Breathing: chest but with cues more diaphragmatic breathing and chest combination  Scar tissue: Yes: vertical scaring throughout full length of abdomen at midline from colectomy, no TTP but profound restrictions throughout and worse at thickened areas at middle and bottom of scaring                 External Perineal Exam: redness throughout vulva and around vaginal opening and bil labia                              Internal Pelvic Floor: TTP throughout superficial and deep layers bil  Patient confirms identification and approves PT to assess internal pelvic floor and treatment Yes No emotional/communication barriers or cognitive limitation. Patient is motivated to learn. Patient understands and agrees with treatment goals and plan. PT explains patient will be examined in standing, sitting, and lying down to see how their muscles and joints work. When they are ready, they will be asked to remove their underwear so PT can examine their perineum. The  patient is also given the option of providing their own chaperone as one is not provided in our facility. The patient also has the right and is explained the right to defer or refuse any part of the evaluation or treatment including the internal exam. With the patient's consent, PT will use one gloved finger to gently assess the muscles of the pelvic floor, seeing how well it contracts and relaxes and if there is muscle symmetry. After, the patient will get dressed and PT and patient will discuss exam findings and plan of care. PT and patient discuss plan of care, schedule, attendance policy and HEP activities.   01/12/24: patient is not comfortable with therapist performing manual work on the vaginal area and prefers to be educated and done at home.   PELVIC MMT:   MMT eval  Vaginal 2/5, 9s, 4 reps  Internal Anal Sphincter   External Anal Sphincter   Puborectalis   Diastasis Recti See above  (Blank rows = not tested)        TONE: Tension noted throughout  PROLAPSE: Not seen in hooklying with cough  TODAY'S TREATMENT:   02/09/24 Neuromuscular re-education: Pelvic floor contraction training: Sit on physioball -pelvic sway, pelvic circles, pelvic tilts, pelvic diagonals Sitting on ball alternate shoulder and hip flexion 20 x Sitting on ball and pull the green band to the opposite side to work the obliques 20 x each side Sitting on ball with scapula retraction with green band 20 x Sit to stand on high mat holding a 10 # kettle bell 5 times at varying heights Walk holding a 10 # kettle bell keeping trunk upright without sway of trunk 150 feet for each hand Plank on wall and walk 20 feet moving arms and legs x 4    02/07/24 Neuromuscular re-education: Pelvic floor contraction training: Sit on physioball -pelvic sway, pelvic circles, pelvic tilts, pelvic diagonals Sitting on ball alternate shoulder and hip flexion 20 x  Sitting on ball and pull the green band to the opposite side to  work the obliques 20 x each side Sit on ball going from sit to stand with pulling back green band into extension 10 x  Sitting on ball bilateral shoulder retraction with green band 15 x Sitting on higher mat to avoid strain on right knee, holding 10 # leaning forward with trunk and pelvic floor contraction Sitting on high mat holding 10 # wt lean forward to standing with band around knees to prevent them from going inward 10 x  Side stepping with band around knees holding 10 #     02/02/24 Neuromuscular re-education: Pelvic floor contraction training: Sit on physioball -pelvic sway, pelvic circles, pelvic tilts, pelvic diagonals Sit to stand with holding 6 # with verbal cues to hinge at hips, keep distance between the rib cage and pubic bone, contract the pelvic floor 10  Standing dead lift with 10 # 10 x 2 Transverse abdominus with hips on disc and tactile cues to the lower abdomen to work on contraction 10 x Supine with hips on the disc with  marching and feeling the transverse abdominus contract 20 x  Supine pulling green band to the side to engage the obliques 15 x each way    PATIENT EDUCATION:  01/19/24 Education details: [email or web address], urge dirll , education on vaginal moisturizers, education on urinating Person educated: Patient Education method: Explanation, Demonstration, Tactile cues, Verbal cues, and Handouts Education comprehension: verbalized understanding, returned demonstration, verbal cues required, tactile cues required, and needs further education  HOME EXERCISE PROGRAM: 01/19/24 Access Code: [email or web address] URL: [email or web address] Date: 01/19/2024 Prepared by: Channing Pereyra  Program Notes stand with hands on wall and feet away from wall. Alternate moving feet out then in then arms out and in 10 x each. 2 times per week  Exercises - Seated Diaphragmatic Breathing  - 1 x daily - 7 x weekly - 2 sets - 10 reps - Seated Pelvic Floor Contraction  - 1 x daily - 7 x  weekly - 2 sets - 10 reps - Seated Cough with Pelvic Floor Contraction and Hand to Mouth  - 1 x daily - 7 x weekly - 2 sets - 10 reps - pelvic floor contract and holds  - 1 x daily - 7 x weekly - 2 sets - 10 reps - 10s holds - Seated Piriformis Stretch with Trunk Bend  - 1 x daily - 7 x weekly - 1 sets - 2 reps - 30 s hold - Seated Hamstring Stretch  - 1 x daily - 7 x weekly - 1 sets - 2 reps - 30 s hold - Seated Hip Adductor Stretch on Swiss Ball  - 1 x daily - 7 x weekly - 1 sets - 2 reps - 30 s hold - Alternating Shoulder Flexion Seated on Swiss Ball  - 1 x daily - 2 x weekly - 2 sets - 10 reps - Seated Lateral Pelvic Tilt on Swiss Ball  - 1 x daily - 2 x weekly - 1 sets - 10 reps - Seated Swiss Ball Pelvic Circles  - 1 x daily - 2 x weekly - 1 sets - 10 reps - Seated Diagonals With [employer] on [employer]  - 1 x daily - 2 x weekly - 1 sets - 10 reps - Swiss Ball Knee Extension  - 1 x daily - 2 x weekly - 10 reps - Standing Hip Extension with Counter Support  - 1 x daily - 2 x weekly - 1 sets - 10 reps - Standing Hip Abduction with Counter Support  - 1 x daily - 2 x weekly - 1 sets - 10 reps - Resistance Pulldown with March  - 1 x daily - 2 x weekly - 2 sets - 10 reps  ASSESSMENT:  CLINICAL IMPRESSION: Patient is a 73 y.o. female  who was seen today for physical therapy  treatment for urinary incontinence with urge and stressors.  Patient has not had any leakage since last visit. She is able to go from sit to stand with increased weight on right leg going from sit to stand. Patient was able to walk with a kettle bell 10 # in one hand and walk without increased in trunk sway due to engaging her core.  Pt would benefit from additional PT to further address deficits.    OBJECTIVE IMPAIRMENTS: decreased activity tolerance, decreased coordination, decreased endurance, decreased mobility, difficulty walking, decreased strength, increased fascial restrictions, increased muscle spasms, impaired  flexibility, improper body mechanics, postural dysfunction, and pain.   ACTIVITY LIMITATIONS: carrying, lifting, squatting, continence, and locomotion  level  PARTICIPATION LIMITATIONS: community activity and working out  PERSONAL FACTORS: Time since onset of injury/illness/exacerbation and 1 comorbidity: medical history are also affecting patient's functional outcome.   REHAB POTENTIAL: Good  CLINICAL DECISION MAKING: Stable/uncomplicated  EVALUATION COMPLEXITY: Low   GOALS: Goals reviewed with patient? Yes  SHORT TERM GOALS: Target date: 12/23/23  Pt to be I with HEP for carry over and continuing recommendations for improved outcomes.   Baseline: Goal status: MET 01/17/24   2.  Pt will be independent with the knack, urge suppression technique, and double voiding in order to improve bladder habits and decrease urinary incontinence.   Baseline:  Goal status: MET 01/17/24  3.  Pt to demonstrate improved coordination of pelvic floor and breathing mechanics with body weight squat with appropriate synergistic patterns to decrease pain and leakage at least 50% of the time for improved ability to complete a  workout without strain at pelvic floor and symptoms.    Baseline:  Goal status: MET 01/17/24   LONG TERM GOALS: Target date: 03/06/24  Pt to be I with advanced HEP for carry over and continuing recommendations for improved outcomes.   Baseline:  Goal status: on going 01/31/24  2.  Pt to demonstrate improved coordination of pelvic floor and breathing mechanics with 10# sit to stands  with appropriate synergistic patterns to decrease pain and leakage at least 75% of the time for improved ability to complete a  workout without strain at pelvic floor and symptoms.    Baseline: leakage is 80% better.  Goal status: on going 01/31/24  3.  Pt to report no more than 1 instance of urinary incontinence in a week for improved confidence with community outings.  Baseline: Patient has had 2  urinary leakages this past week but does happen after taking her water pill.  Goal status: on going 01/31/24  4.  Pt to report improved comfort and decreased dryness at external pelvic floor due to decreased need of pads and possible use of external moisturizer.  Baseline:  Goal status: met 01/26/24   PLAN:  PT FREQUENCY: 1-2x/week  PT DURATION: 4 weeks  PLANNED INTERVENTIONS: 97110-Therapeutic exercises, 97530- Therapeutic activity, 97112- Neuromuscular re-education, 97535- Self Care, [id number]- Manual therapy, [phone]- Canalith repositioning, [id number]- Aquatic Therapy, [phone] (1-2 muscles), [id number] (3+ muscles)- Dry Needling, Patient/Family education, Taping, Joint mobilization, Spinal mobilization, Scar mobilization, DME instructions, Cryotherapy, Moist heat, and Biofeedback  PLAN FOR NEXT SESSION:   core engagement sit to stand with weight; 2 sessions left  Channing Pereyra, PT 02/09/24 2:44 PM  [facility] [address] [place], [place] [address] Phone # [phone] Fax [phone]

## 2024-02-14 ENCOUNTER — Ambulatory Visit: Admitting: Physical Therapy

## 2024-02-14 ENCOUNTER — Encounter: Payer: Self-pay | Admitting: Physical Therapy

## 2024-02-14 DIAGNOSIS — M6281 Muscle weakness (generalized): Secondary | ICD-10-CM

## 2024-02-14 DIAGNOSIS — R279 Unspecified lack of coordination: Secondary | ICD-10-CM

## 2024-02-14 DIAGNOSIS — R293 Abnormal posture: Secondary | ICD-10-CM

## 2024-02-14 NOTE — Therapy (Signed)
 OUTPATIENT PHYSICAL THERAPY FEMALE PELVIC TREATMENT   Patient Name: Beverly [name] MRN: [id number] DOB:1951/02/09, 73 y.o., female Today's Date: 02/14/2024  Progress Note Reporting Period 11/25/23 to 02/07/24  See note below for Objective Data and Assessment of Progress/Goals.     END OF SESSION:  PT End of Session - 02/14/24 1405     Visit Number 12    Date for Recertification  03/06/24    Authorization Type Medicare    Authorization - Visit Number 11    Progress Note Due on Visit 20    PT Start Time 1400    PT Stop Time 1440    PT Time Calculation (min) 40 min    Activity Tolerance Patient tolerated treatment well    Behavior During Therapy WFL for tasks assessed/performed            Past Medical History:  Diagnosis Date   Family history of adverse reaction to anesthesia    pt's mother has hx. of post-op nausea   History of Crohn's disease    Ileostomy in place [facility])    Lichen sclerosus    Osteopenia    Past Surgical History:  Procedure Laterality Date   BREAST REDUCTION SURGERY  2011   ORIF ANKLE FRACTURE Right 06/08/2014   Procedure: OPEN REDUCTION INTERNAL FIXATION (ORIF) RIGHT BIMALLEOLAR ANKLE FRACTURE;  Surgeon: Evalene JONETTA Chancy, MD;  Location: [facility];  Service: Orthopedics;  Laterality: Right;   REDUCTION MAMMAPLASTY Bilateral    STRABISMUS SURGERY     X 2   TOTAL COLECTOMY  2004   TUBAL LIGATION     Patient Active Problem List   Diagnosis Date Noted   History of vitamin D  deficiency 07/28/2013   Lichen sclerosus    Status post bilateral breast reduction 02/07/2012    PCP:    Perri Ronal PARAS, MD    REFERRING PROVIDER:    Cathlyn JAYSON Nikki Bobie FORBES, MD    REFERRING DIAG: [phone] (ICD-10-CM) - Mixed incontinence  THERAPY DIAG:  Muscle weakness (generalized)  Unspecified lack of coordination  Abnormal posture  Rationale for Evaluation and Treatment: Rehabilitation  ONSET DATE: at least 5 years  SUBJECTIVE:                                                                                                                                                                                            SUBJECTIVE STATEMENT: No leakage since last visit. I feel like a normal person.   FUNCTIONAL LIMITATIONS: careful with outings with fear of urinary incontinence   PERTINENT HISTORY:  Medications for current condition: no Osteopenia, Lichen sclerosus, Crohn's  disease, TOTAL COLECTOMY,   Sexual abuse: No   PAIN:  Are you having pain? No   PRECAUTIONS: None  RED FLAGS: None   WEIGHT BEARING RESTRICTIONS: No  FALLS:  Has patient fallen in last 6 months? No  OCCUPATION: retired   ACTIVITY LEVEL : increasing since recent retirement, now going to PT for back pain then once discharged now goes to [facility] (2x weekly)  PLOF: Independent  PATIENT GOALS: to have no urinary incontinent,minimize risk of worsening urinary incontinence    BOWEL MOVEMENT: Has ileostomy    URINATION: Pain with urination: No Fully empty bladder: No needs to strain to empty sometimes                                 Post-void dribble: No Stream: Strong and Weak Urgency: No Frequency:during the day not usually but does if she takes lasix                                                           Nocturia: Yes:  1x   Leakage: Urge to void, Walking to the bathroom, Coughing, Sneezing, and Exercise 02/14/24: no urinary leakage.  Pads/briefs: Yes: 1 pad if needed 02/14/24: does not wear a pad   INTERCOURSE:  Not active  PREGNANCY: Vaginal deliveries 2 Tearing  Episiotomy Yes x2 C-section deliveries 0 Currently pregnant No  PROLAPSE: None   OBJECTIVE:  Note: Objective measures were completed at Evaluation unless otherwise noted.   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)  Lt: 8s bu pelvic drop Sit-up test:1/3   GAIT: WFL  POSTURE: rounded shoulders, forward head, and posterior pelvic tilt   LUMBARAROM/PROM:  A/PROM A/PROM  Eval (% available) 02/14/24  Flexion 75 100  Extension 75 100  Right lateral flexion 100 100  Left lateral flexion 100 100  Right rotation 75 100  Left rotation 75 100   (Blank rows = not tested)  LOWER EXTREMITY ROM:  Bil hamstrings and adductors limited by 25%  LOWER EXTREMITY MMT:  R hip grossly 3+/5 except flexion 3/5; Lt grossly 4/5 except flexion 3+/5 01/26/24; right hip flexion 5/5 grossley 4/5; left hip strength 5/5 02/14/24: bilateral hip strength is  5/5,  PALPATION:  General: tightness noted in bil thoracic and lumbar paraspinals   Pelvic Alignment: WFL  Abdominal:   Diastasis: No Distortion: No  Breathing: chest but with cues more diaphragmatic breathing and chest combination  Scar tissue: Yes: vertical scaring throughout full length of abdomen at midline from colectomy, no TTP but profound restrictions throughout and worse at thickened areas at middle and bottom of scaring                 External Perineal Exam: redness throughout vulva and around vaginal opening and bil labia                              Internal Pelvic Floor: TTP throughout superficial and deep layers bil   Patient confirms identification and  approves PT to assess internal pelvic floor and treatment Yes No emotional/communication barriers or cognitive limitation. Patient is motivated to learn. Patient understands and agrees with treatment goals and plan. PT explains patient will be examined in standing, sitting, and lying down to see how their muscles and joints work. When they are ready, they will be asked to remove their underwear so PT can examine their perineum. The patient is also given the option of providing their own chaperone as one is  not provided in our facility. The patient also has the right and is explained the right to defer or refuse any part of the evaluation or treatment including the internal exam. With the patient's consent, PT will use one gloved finger to gently assess the muscles of the pelvic floor, seeing how well it contracts and relaxes and if there is muscle symmetry. After, the patient will get dressed and PT and patient will discuss exam findings and plan of care. PT and patient discuss plan of care, schedule, attendance policy and HEP activities.   01/12/24: patient is not comfortable with therapist performing manual work on the vaginal area and prefers to be educated and done at home.   PELVIC MMT:   MMT eval  Vaginal 2/5, 9s, 4 reps  Internal Anal Sphincter   External Anal Sphincter   Puborectalis   Diastasis Recti See above  (Blank rows = not tested)        TONE: Tension noted throughout  PROLAPSE: Not seen in [facility] with cough  TODAY'S TREATMENT:   02/14/24 Neuromuscular re-education: Pelvic floor contraction training: Sit on physioball -pelvic sway, pelvic circles, pelvic tilts, pelvic diagonals Sit to stand on high mat holding a 10 # kettle bell 5 times at varying heights Walk holding a 10 # kettle bell keeping trunk upright without sway of trunk 150 feet for each hand Plank on wall and walk 20 feet moving arms and legs x 4 Plank on wall lifting opposite arm and leg 10 x  2 Bilateral shoulder extension with green band with alternate hip flexion 2 x 10  Standing  holding band to side and marching with leg closest to the wall 10 x each way    02/09/24 Neuromuscular re-education: Pelvic floor contraction training: Sit on physioball -pelvic sway, pelvic circles, pelvic tilts, pelvic diagonals Sitting on ball alternate shoulder and hip flexion 20 x Sitting on ball and pull the green band to the opposite side to work the obliques 20 x each side Sitting on ball with scapula retraction  with green band 20 x Sit to stand on high mat holding a 10 # kettle bell 5 times at varying heights Walk holding a 10 # kettle bell keeping trunk upright without sway of trunk 150 feet for each hand Plank on wall and walk 20 feet moving arms and legs x 4    02/07/24 Neuromuscular re-education: Pelvic floor contraction training: Sit on physioball -pelvic sway, pelvic circles, pelvic tilts, pelvic diagonals Sitting on ball alternate shoulder and hip flexion 20 x  Sitting on ball and pull the green band to the opposite side to work the obliques 20 x each side Sit on ball going from sit to stand with pulling back green band into extension 10 x  Sitting on ball bilateral shoulder retraction with green band 15 x Sitting on higher mat to avoid strain on right knee, holding 10 # leaning forward with trunk and pelvic floor contraction Sitting on high mat holding 10 # wt lean forward  to standing with band around knees to prevent them from going inward 10 x  Side stepping with band around knees holding 10 #       PATIENT EDUCATION:  01/19/24 Education details: [email or web address], urge dirll , education on vaginal moisturizers, education on urinating Person educated: Patient Education method: Explanation, Demonstration, Tactile cues, Verbal cues, and Handouts Education comprehension: verbalized understanding, returned demonstration, verbal cues required, tactile cues required, and needs further education  HOME EXERCISE PROGRAM: 01/19/24 Access Code: [email or web address] URL: [email or web address] Date: 01/19/2024 Prepared by: Channing Pereyra  Program Notes stand with hands on wall and feet away from wall. Alternate moving feet out then in then arms out and in 10 x each. 2 times per week  Exercises - Seated Diaphragmatic Breathing  - 1 x daily - 7 x weekly - 2 sets - 10 reps - Seated Pelvic Floor Contraction  - 1 x daily - 7 x weekly - 2 sets - 10 reps - Seated Cough with Pelvic Floor Contraction and  Hand to Mouth  - 1 x daily - 7 x weekly - 2 sets - 10 reps - pelvic floor contract and holds  - 1 x daily - 7 x weekly - 2 sets - 10 reps - 10s holds - Seated Piriformis Stretch with Trunk Bend  - 1 x daily - 7 x weekly - 1 sets - 2 reps - 30 s hold - Seated Hamstring Stretch  - 1 x daily - 7 x weekly - 1 sets - 2 reps - 30 s hold - Seated Hip Adductor Stretch on Swiss Ball  - 1 x daily - 7 x weekly - 1 sets - 2 reps - 30 s hold - Alternating Shoulder Flexion Seated on Swiss Ball  - 1 x daily - 2 x weekly - 2 sets - 10 reps - Seated Lateral Pelvic Tilt on Swiss Ball  - 1 x daily - 2 x weekly - 1 sets - 10 reps - Seated Swiss Ball Pelvic Circles  - 1 x daily - 2 x weekly - 1 sets - 10 reps - Seated Diagonals With [employer] on [employer]  - 1 x daily - 2 x weekly - 1 sets - 10 reps - Swiss Ball Knee Extension  - 1 x daily - 2 x weekly - 10 reps - Standing Hip Extension with Counter Support  - 1 x daily - 2 x weekly - 1 sets - 10 reps - Standing Hip Abduction with Counter Support  - 1 x daily - 2 x weekly - 1 sets - 10 reps - Resistance Pulldown with March  - 1 x daily - 2 x weekly - 2 sets - 10 reps  ASSESSMENT:  CLINICAL IMPRESSION: Patient is a 73 y.o. female  who was seen today for physical therapy  treatment for urinary incontinence with urge and stressors.   Patient is not leaking urine. She is not wearing a pad.  Bilateral hip strength is 5/5. Patient independent with her HEP. She will be going to the gym to exercise in addition.   OBJECTIVE IMPAIRMENTS: decreased activity tolerance, decreased coordination, decreased endurance, decreased mobility, difficulty walking, decreased strength, increased fascial restrictions, increased muscle spasms, impaired flexibility, improper body mechanics, postural dysfunction, and pain.   ACTIVITY LIMITATIONS: carrying, lifting, squatting, continence, and locomotion level  PARTICIPATION LIMITATIONS: community activity and working out  PERSONAL  FACTORS: Time since onset of injury/illness/exacerbation and 1 comorbidity: medical history are also affecting patient's functional outcome.   REHAB  POTENTIAL: Good  CLINICAL DECISION MAKING: Stable/uncomplicated  EVALUATION COMPLEXITY: Low   GOALS: Goals reviewed with patient? Yes  SHORT TERM GOALS: Target date: 12/23/23  Pt to be I with HEP for carry over and continuing recommendations for improved outcomes.   Baseline: Goal status: MET 01/17/24   2.  Pt will be independent with the knack, urge suppression technique, and double voiding in order to improve bladder habits and decrease urinary incontinence.   Baseline:  Goal status: MET 01/17/24  3.  Pt to demonstrate improved coordination of pelvic floor and breathing mechanics with body weight squat with appropriate synergistic patterns to decrease pain and leakage at least 50% of the time for improved ability to complete a  workout without strain at pelvic floor and symptoms.    Baseline:  Goal status: MET 01/17/24   LONG TERM GOALS: Target date: 03/06/24  Pt to be I with advanced HEP for carry over and continuing recommendations for improved outcomes.   Baseline:  Goal status: Met 02/14/24  2.  Pt to demonstrate improved coordination of pelvic floor and breathing mechanics with 10# sit to stands  with appropriate synergistic patterns to decrease pain and leakage at least 75% of the time for improved ability to complete a  workout without strain at pelvic floor and symptoms.    Baseline: leakage is 80% better.  Goal status: Met 02/14/24  3.  Pt to report no more than 1 instance of urinary incontinence in a week for improved confidence with community outings.  Baseline: Patient has had 2 urinary leakages this past week but does happen after taking her water pill.  Goal status: Met 02/14/24  4.  Pt to report improved comfort and decreased dryness at external pelvic floor due to decreased need of pads and possible use of  external moisturizer.  Baseline:  Goal status: met 01/26/24   PLAN: Discharge to HEP this visit.    Channing Pereyra, PT 02/14/2024 2:41 PM  [facility] [address], [address] [place], [place] [address] Phone # [phone] Fax [phone]   PHYSICAL THERAPY DISCHARGE SUMMARY  Visits from Start of Care: 11  Current functional level related to goals / functional outcomes: See above.    Remaining deficits: See above.    Education / Equipment: HEP   Patient agrees to discharge. Patient goals were met. Patient is being discharged due to meeting the stated rehab goals. Thank you for the referral.   Channing Pereyra, PT 02/14/2024 2:41 PM

## 2024-02-16 ENCOUNTER — Encounter: Admitting: Physical Therapy

## 2024-04-03 ENCOUNTER — Ambulatory Visit: Admitting: Neurology

## 2024-04-28 ENCOUNTER — Ambulatory Visit: Admitting: Neurology

## 2024-10-02 ENCOUNTER — Ambulatory Visit: Admitting: Obstetrics and Gynecology
